# Patient Record
Sex: Male | Born: 1980 | Race: White | Hispanic: No | Marital: Single | Smoking: Former smoker
Health system: Southern US, Community
[De-identification: ages and names within clinical notes are randomized; demographics above are authoritative.]

## PROBLEM LIST (undated history)

## (undated) ENCOUNTER — Emergency Department (HOSPITAL_COMMUNITY): Admission: EM | Payer: No Typology Code available for payment source | Source: Home / Self Care

## (undated) DIAGNOSIS — F4310 Post-traumatic stress disorder, unspecified: Secondary | ICD-10-CM

## (undated) DIAGNOSIS — L309 Dermatitis, unspecified: Secondary | ICD-10-CM

## (undated) DIAGNOSIS — R569 Unspecified convulsions: Secondary | ICD-10-CM

## (undated) HISTORY — PX: HERNIA REPAIR: SHX51

---

## 2005-05-06 ENCOUNTER — Ambulatory Visit: Payer: Self-pay | Admitting: Family Medicine

## 2006-05-17 ENCOUNTER — Ambulatory Visit: Payer: Self-pay | Admitting: Family Medicine

## 2010-10-14 ENCOUNTER — Telehealth: Payer: Self-pay | Admitting: Family Medicine

## 2010-10-15 ENCOUNTER — Ambulatory Visit: Payer: Self-pay | Admitting: Family Medicine

## 2010-10-15 DIAGNOSIS — J392 Other diseases of pharynx: Secondary | ICD-10-CM | POA: Insufficient documentation
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
## 2010-12-29 NOTE — Assessment & Plan Note (Signed)
Summary: throat problem/rl   Vital Signs:  Patient profile:   30 year old male Height:      67 inches Weight:      169 pounds BMI:     26.56 Temp:     97.1 degrees F oral Pulse rate:   72 / minute Pulse rhythm:   regular BP sitting:   110 / 78  (left arm) Cuff size:   large  Vitals Entered By: Sydell Axon LPN (October 15, 2010 12:29 PM) CC: Has to keep clearing his throat constantly, this has been going on for about 6 months, previous medications given have not helped   History of Present Illness: Pt here for long recent history of difficulty clearing his throat which feels located about the level off  the manubrium. [facility] and [facility] given Clemastine (an old antihistamine) initially without diagnostics. He then had indirect laryngoscopy with a mirror and was thought to have callouses on his vocal chords, at which time he was  given Trimeprazine Tartrate(another antihistamine). When he went back the next time, they did nasopharyngoscopy, was told  "The callouses in the back of the throat are gone"  and was given no further medication  as the exam looked nml. He has continued to have the same sxs, the overwhelming one being thick PND requiring active clearing repeatedly. He took Mucinex yesterday and has been spitting up lots of congestion. He had a leak under the house which  he was initially unaware of and has recently fixed. His girlfriend who lives at his home with him has had stuffy nose but not the congestion requiring clearing the throat. He has lived at his  house for almost a year, sxs have been bothering him for the last six months, worst in the living room where he spends lots of time both sleeping on the couch and watching TV. The air vent is high up on the wall facing the couch. He has not had fever or chills, ear pain but has had some fleeting headaches. He does not smoke.  Problems Prior to Update: None  Medications Prior to Update: 1)   None  Physical Exam  General:  Well-developed,well-nourished,in no acute distress; alert,appropriate and cooperative throughout examination Head:  Normocephalic and atraumatic without obvious abnormalities. No apparent alopecia or balding. Eyes:  No corneal or conjunctival inflammation noted. EOMI. Perrla. Funduscopic exam benign, without hemorrhages, exudates or papilledema. Vision grossly normal. Ears:  External ear exam shows no significant lesions or deformities.  Otoscopic examination reveals clear canals, tympanic membranes are intact bilaterally without bulging, retraction, inflammation or discharge. Hearing is grossly normal bilaterally. Nose:  External nasal examination shows no deformity or inflammation. Nasal mucosa are pink and moist without lesions or exudates. Mouth:  Oral mucosa and oropharynx without lesions or exudates.  Teeth in good repair. Neck:  No deformities, masses, or tenderness noted. Chest Wall:  No deformities, masses, tenderness or gynecomastia noted. Lungs:  Normal respiratory effort, chest expands symmetrically. Lungs are clear to auscultation, no crackles or wheezes. Heart:  Normal rate and regular rhythm. S1 and S2 normal without gallop, murmur, click, rub or other extra sounds. Skin:  Intact without suspicious lesions or rashes   Impression & Recommendations:  Problem # 1:  UNSPECIFIED DISEASE OF PHARYNX (ICD-478.20) Assessment New  Will treat or attempt to treat for fungal infection and throat irritation. Trial of nystatin  to gargle and occas swallow. Trial of Flovent for chronic inflammation (  given samps) Recheck in 2-3 weeks.  Orders: No Charge Patient Arrived (NCPA0) (NCPA0)  Complete Medication List: 1)  Mucinex D 60-600 Mg Xr12h-tab (Pseudoephedrine-guaifenesin) .... As needed 2)  Nystatin 100000 Unit/ml Susp (Nystatin) .... Gargle  four times daily  Patient Instructions: 1)  RTC 2-3 weeks. Prescriptions: NYSTATIN 100000 UNIT/ML SUSP  (NYSTATIN) gargle  four times daily  #1 bottle x 2   Entered and Authorized by:   Shaune Leeks MD   Signed by:   Shaune Leeks MD on 10/15/2010   Method used:   Print then Give to Patient   RxID:   [id number]    Orders Added: 1)  No Charge Patient Arrived (NCPA0) [NCPA0]

## 2017-10-03 ENCOUNTER — Encounter (HOSPITAL_COMMUNITY): Payer: Self-pay | Admitting: Family Medicine

## 2017-10-03 DIAGNOSIS — S39012A Strain of muscle, fascia and tendon of lower back, initial encounter: Secondary | ICD-10-CM | POA: Diagnosis not present

## 2017-10-03 DIAGNOSIS — Z87891 Personal history of nicotine dependence: Secondary | ICD-10-CM | POA: Insufficient documentation

## 2017-10-03 DIAGNOSIS — S7001XA Contusion of right hip, initial encounter: Secondary | ICD-10-CM | POA: Insufficient documentation

## 2017-10-03 DIAGNOSIS — Y999 Unspecified external cause status: Secondary | ICD-10-CM | POA: Diagnosis not present

## 2017-10-03 DIAGNOSIS — Y939 Activity, unspecified: Secondary | ICD-10-CM | POA: Diagnosis not present

## 2017-10-03 DIAGNOSIS — S7011XA Contusion of right thigh, initial encounter: Secondary | ICD-10-CM | POA: Diagnosis not present

## 2017-10-03 DIAGNOSIS — Y92410 Unspecified street and highway as the place of occurrence of the external cause: Secondary | ICD-10-CM | POA: Diagnosis not present

## 2017-10-03 DIAGNOSIS — S3992XA Unspecified injury of lower back, initial encounter: Secondary | ICD-10-CM | POA: Diagnosis present
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
## 2017-10-04 ENCOUNTER — Emergency Department (HOSPITAL_COMMUNITY)
Admission: EM | Admit: 2017-10-04 | Discharge: 2017-10-04 | Disposition: A | Discharge status: Home / Self Care | Payer: Non-veteran care | Attending: Emergency Medicine | Admitting: Emergency Medicine

## 2017-10-04 ENCOUNTER — Emergency Department (HOSPITAL_COMMUNITY): Payer: Non-veteran care

## 2017-10-04 DIAGNOSIS — S7011XA Contusion of right thigh, initial encounter: Secondary | ICD-10-CM

## 2017-10-04 DIAGNOSIS — S7001XA Contusion of right hip, initial encounter: Secondary | ICD-10-CM

## 2017-10-04 DIAGNOSIS — S39012A Strain of muscle, fascia and tendon of lower back, initial encounter: Secondary | ICD-10-CM

## 2017-10-04 MED ORDER — IBUPROFEN 800 MG PO TABS: 800.0000 mg | ORAL_TABLET | Freq: Three times a day (TID) | ORAL | 0 refills | Status: DC | PRN

## 2017-10-04 MED ORDER — TRAMADOL HCL 50 MG PO TABS: 50.0000 mg | ORAL_TABLET | Freq: Four times a day (QID) | ORAL | 0 refills | Status: DC | PRN
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
## 2017-10-04 NOTE — Discharge Instructions (Signed)
Return here as needed.  Follow-up with your primary doctor. your x-rays did not show any abnormalities.  Use ice and heat on the area that is sore

## 2017-10-22 ENCOUNTER — Emergency Department (HOSPITAL_COMMUNITY): Payer: Non-veteran care

## 2017-10-22 ENCOUNTER — Emergency Department (HOSPITAL_COMMUNITY)
Admission: EM | Admit: 2017-10-22 | Discharge: 2017-10-22 | Disposition: A | Discharge status: Home / Self Care | Payer: Non-veteran care | Attending: Emergency Medicine | Admitting: Emergency Medicine

## 2017-10-22 ENCOUNTER — Other Ambulatory Visit: Payer: Self-pay

## 2017-10-22 DIAGNOSIS — W19XXXA Unspecified fall, initial encounter: Secondary | ICD-10-CM | POA: Diagnosis not present

## 2017-10-22 DIAGNOSIS — Z87891 Personal history of nicotine dependence: Secondary | ICD-10-CM | POA: Insufficient documentation

## 2017-10-22 DIAGNOSIS — Y939 Activity, unspecified: Secondary | ICD-10-CM | POA: Insufficient documentation

## 2017-10-22 DIAGNOSIS — Y998 Other external cause status: Secondary | ICD-10-CM | POA: Insufficient documentation

## 2017-10-22 DIAGNOSIS — F10920 Alcohol use, unspecified with intoxication, uncomplicated: Secondary | ICD-10-CM

## 2017-10-22 DIAGNOSIS — F10120 Alcohol abuse with intoxication, uncomplicated: Secondary | ICD-10-CM | POA: Insufficient documentation

## 2017-10-22 DIAGNOSIS — W010XXA Fall on same level from slipping, tripping and stumbling without subsequent striking against object, initial encounter: Secondary | ICD-10-CM

## 2017-10-22 DIAGNOSIS — Y92009 Unspecified place in unspecified non-institutional (private) residence as the place of occurrence of the external cause: Secondary | ICD-10-CM | POA: Insufficient documentation

## 2017-10-22 DIAGNOSIS — S01311A Laceration without foreign body of right ear, initial encounter: Secondary | ICD-10-CM | POA: Diagnosis not present

## 2017-10-22 DIAGNOSIS — S0101XA Laceration without foreign body of scalp, initial encounter: Secondary | ICD-10-CM | POA: Insufficient documentation

## 2017-10-22 MED ORDER — LIDOCAINE-EPINEPHRINE (PF) 2 %-1:200000 IJ SOLN
10.0000 mL | Freq: Once | INTRAMUSCULAR | Status: AC
  Administered 2017-10-22: 10 mL
  Filled 2017-10-22: qty 20

## 2017-10-22 NOTE — ED Notes (Signed)
Pt is refusing the CT scan. MD made aware

## 2017-10-22 NOTE — Discharge Instructions (Signed)
Staples need to be removed in 7-10 days. That can be done here, or at an urgent care center.  You did not allow me to do a CT scan tonight. I cannot be sure there is no brain injury from the fall. If you are having any problems at all, please return so CT scan can be done.
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
## 2017-11-01 ENCOUNTER — Encounter (HOSPITAL_COMMUNITY): Payer: Self-pay | Admitting: Family Medicine

## 2017-11-01 ENCOUNTER — Ambulatory Visit (HOSPITAL_COMMUNITY)
Admission: EM | Admit: 2017-11-01 | Discharge: 2017-11-01 | Disposition: A | Discharge status: Home / Self Care | Payer: No Typology Code available for payment source | Attending: Family Medicine | Admitting: Family Medicine

## 2017-11-01 DIAGNOSIS — Z4802 Encounter for removal of sutures: Secondary | ICD-10-CM

## 2017-11-01 NOTE — ED Provider Notes (Signed)
  [facility]   [id number] 11/01/17 Arrival Time: 1631   SUBJECTIVE:  Norberta KeensJohn M Tomaselli is a 36 y.o. male who presents to the urgent care with complaint of scalp staple removal from two separate scalp lacerations on Nov. 24     History reviewed. No pertinent past medical history. No family history on file. Social History   Socioeconomic History  . Marital status: Married    Spouse name: Not on file  . Number of children: Not on file  . Years of education: Not on file  . Highest education level: Not on file  Social Needs  . Financial resource strain: Not on file  . Food insecurity - worry: Not on file  . Food insecurity - inability: Not on file  . Transportation needs - medical: Not on file  . Transportation needs - non-medical: Not on file  Occupational History  . Not on file  Tobacco Use  . Smoking status: Former Games developermoker  . Smokeless tobacco: Never Used  Substance and Sexual Activity  . Alcohol use: Yes    Comment: Weekends   . Drug use: No  . Sexual activity: Not on file  Other Topics Concern  . Not on file  Social History Narrative  . Not on file   No outpatient medications have been marked as taking for the 11/01/17 encounter [facility](Hospital Encounter).   Not on File    ROS: As per HPI, remainder of ROS negative.   OBJECTIVE:   There were no vitals filed for this visit.   General appearance: alert; no distress Eyes: PERRL; EOMI; conjunctiva normal HENT: normocephalic; Staples removed by nurse. Skin: warm and dry Neurologic: normal gait; grossly normal Psychological: alert and cooperative; normal mood and affect      Labs:  No results found for this or any previous visit.  Labs Reviewed - No data to display  No results found.     ASSESSMENT & PLAN:  1. Encounter for staple removal     No orders of the defined types were placed in this encounter.   Reviewed expectations re: course of current medical issues. Questions  answered. Outlined signs and symptoms indicating need for more acute intervention. Patient verbalized understanding. After Visit Summary given.    Procedures:      Elvina SidleLauenstein, Rikki Smestad, MD 11/01/17 1659

## 2017-11-01 NOTE — ED Notes (Signed)
6 staples removed from head. Wound clean and dry. Well healed.

## 2018-12-07 ENCOUNTER — Other Ambulatory Visit: Payer: Self-pay

## 2018-12-07 ENCOUNTER — Emergency Department (HOSPITAL_COMMUNITY): Payer: No Typology Code available for payment source

## 2018-12-07 ENCOUNTER — Emergency Department (HOSPITAL_COMMUNITY)
Admission: EM | Admit: 2018-12-07 | Discharge: 2018-12-07 | Disposition: A | Discharge status: Home / Self Care | Payer: No Typology Code available for payment source | Attending: Emergency Medicine | Admitting: Emergency Medicine

## 2018-12-07 ENCOUNTER — Encounter (HOSPITAL_COMMUNITY): Payer: Self-pay | Admitting: Emergency Medicine

## 2018-12-07 DIAGNOSIS — I4891 Unspecified atrial fibrillation: Secondary | ICD-10-CM | POA: Diagnosis not present

## 2018-12-07 DIAGNOSIS — R569 Unspecified convulsions: Secondary | ICD-10-CM | POA: Diagnosis present

## 2018-12-07 DIAGNOSIS — E860 Dehydration: Secondary | ICD-10-CM | POA: Diagnosis not present

## 2018-12-07 DIAGNOSIS — I951 Orthostatic hypotension: Secondary | ICD-10-CM | POA: Diagnosis not present

## 2018-12-07 DIAGNOSIS — Z79899 Other long term (current) drug therapy: Secondary | ICD-10-CM | POA: Insufficient documentation

## 2018-12-07 DIAGNOSIS — Z87891 Personal history of nicotine dependence: Secondary | ICD-10-CM | POA: Diagnosis not present

## 2018-12-07 LAB — COMPREHENSIVE METABOLIC PANEL
ALK PHOS: 59 U/L (ref 38–126)
ALT: 45 U/L — ABNORMAL HIGH (ref 0–44)
ANION GAP: 14 (ref 5–15)
AST: 70 U/L — ABNORMAL HIGH (ref 15–41)
Albumin: 4.2 g/dL (ref 3.5–5.0)
BUN: 7 mg/dL (ref 6–20)
CALCIUM: 9.5 mg/dL (ref 8.9–10.3)
CO2: 26 mmol/L (ref 22–32)
CREATININE: 1.06 mg/dL (ref 0.61–1.24)
Chloride: 93 mmol/L — ABNORMAL LOW (ref 98–111)
GFR calc non Af Amer: >60 mL/min (ref >60)
Glucose, Bld: 129 mg/dL — ABNORMAL HIGH (ref 70–99)
Potassium: 4 mmol/L (ref 3.5–5.1)
SODIUM: 133 mmol/L — AB (ref 135–145)
Total Bilirubin: 2.5 mg/dL — ABNORMAL HIGH (ref 0.3–1.2)
Total Protein: 7.2 g/dL (ref 6.5–8.1)

## 2018-12-07 LAB — CBC WITH DIFFERENTIAL/PLATELET
Abs Immature Granulocytes: 0.04 10*3/uL (ref 0.00–0.07)
Basophils Absolute: 0 10*3/uL (ref 0.0–0.1)
Basophils Relative: 0 %
EOS ABS: 0 10*3/uL (ref 0.0–0.5)
EOS PCT: 0 %
HEMATOCRIT: 42.8 % (ref 39.0–52.0)
Hemoglobin: 14.9 g/dL (ref 13.0–17.0)
Immature Granulocytes: 1 %
Lymphocytes Relative: 12 %
Lymphs Abs: 0.8 10*3/uL (ref 0.7–4.0)
MCH: 33.5 pg (ref 26.0–34.0)
MCHC: 34.8 g/dL (ref 30.0–36.0)
MCV: 96.2 fL (ref 80.0–100.0)
MONOS PCT: 8 %
Monocytes Absolute: 0.6 10*3/uL (ref 0.1–1.0)
NRBC: 0 % (ref 0.0–0.2)
Neutro Abs: 5.3 10*3/uL (ref 1.7–7.7)
Neutrophils Relative %: 79 %
Platelets: 116 10*3/uL — ABNORMAL LOW (ref 150–400)
RBC: 4.45 MIL/uL (ref 4.22–5.81)
RDW: 12 % (ref 11.5–15.5)
WBC: 6.8 10*3/uL (ref 4.0–10.5)

## 2018-12-07 LAB — URINALYSIS, ROUTINE W REFLEX MICROSCOPIC
Bilirubin Urine: NEGATIVE
GLUCOSE, UA: NEGATIVE mg/dL
Hgb urine dipstick: NEGATIVE
Ketones, ur: 5 mg/dL — AB
LEUKOCYTES UA: NEGATIVE
NITRITE: NEGATIVE
PH: 8 (ref 5.0–8.0)
Protein, ur: NEGATIVE mg/dL
Specific Gravity, Urine: 1.005 (ref 1.005–1.030)

## 2018-12-07 LAB — RAPID URINE DRUG SCREEN, HOSP PERFORMED
Amphetamines: NOT DETECTED
Barbiturates: NOT DETECTED
Benzodiazepines: NOT DETECTED
COCAINE: NOT DETECTED
Opiates: NOT DETECTED
TETRAHYDROCANNABINOL: NOT DETECTED

## 2018-12-07 LAB — ETHANOL: Alcohol, Ethyl (B): <10 mg/dL (ref <10)

## 2018-12-07 MED ORDER — LORAZEPAM 2 MG/ML IJ SOLN: 1.0000 mg | Freq: Once | INTRAMUSCULAR | Status: DC | PRN

## 2018-12-07 MED ORDER — SODIUM CHLORIDE 0.9 % IV BOLUS
1000.0000 mL | Freq: Once | INTRAVENOUS | Status: AC
  Administered 2018-12-07: 1000 mL via INTRAVENOUS

## 2018-12-07 MED ORDER — DILTIAZEM HCL-DEXTROSE 100-5 MG/100ML-% IV SOLN (PREMIX)
5.0000 mg/h | INTRAVENOUS | Status: DC
  Filled 2018-12-07: qty 100

## 2018-12-07 MED ORDER — SODIUM CHLORIDE 0.9 % IV SOLN: INTRAVENOUS | Status: DC

## 2018-12-07 MED ORDER — ELIQUIS 5 MG VTE STARTER PACK: ORAL_TABLET | ORAL | 0 refills | Status: DC

## 2018-12-07 MED ORDER — DILTIAZEM HCL 25 MG/5ML IV SOLN: 10.0000 mg | Freq: Once | INTRAVENOUS | Status: DC

## 2018-12-07 MED ORDER — DILTIAZEM HCL ER COATED BEADS 120 MG PO CP24: 120.0000 mg | ORAL_CAPSULE | Freq: Every day | ORAL | 0 refills | Status: DC

## 2018-12-07 MED ORDER — DILTIAZEM HCL 25 MG/5ML IV SOLN
15.0000 mg | Freq: Once | INTRAVENOUS | Status: AC
  Administered 2018-12-07: 15 mg via INTRAVENOUS
  Filled 2018-12-07: qty 5
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
## 2018-12-07 NOTE — Discharge Instructions (Signed)
You have been diagnosed with atrial fibrillation.  This could increase your risk of developing blood clot in your lungs.  Please take Eliquis as prescribed.  Take cardizem to help control your heart rate.  Follow up with the afib clinic and with your doctor for further care.  Avoid alcohol use.  Return if your condition worsen.  Drink plenty of fluid and rest.

## 2018-12-07 NOTE — ED Notes (Signed)
Help get patient undress on the monitor patient is resting with call bell in reach and nurse at bedside 
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
## 2018-12-07 NOTE — ED Notes (Signed)
Patient transported to CT 

## 2018-12-07 NOTE — ED Notes (Signed)
Patient verbalizes understanding of discharge instructions. Opportunity for questioning and answers were provided. Armband removed by staff, pt discharged from ED. Given all belongings, ambulated out into lobby with family

## 2018-12-11 ENCOUNTER — Encounter (HOSPITAL_COMMUNITY): Payer: Self-pay | Admitting: Nurse Practitioner

## 2018-12-11 ENCOUNTER — Ambulatory Visit (HOSPITAL_COMMUNITY)
Admission: RE | Admit: 2018-12-11 | Discharge: 2018-12-11 | Disposition: A | Discharge status: Home / Self Care | Payer: No Typology Code available for payment source | Source: Ambulatory Visit | Attending: Nurse Practitioner | Admitting: Nurse Practitioner

## 2018-12-11 VITALS — BP 118/86 | HR 71 | Ht 67.0 in | Wt 170.4 lb

## 2018-12-11 DIAGNOSIS — Z87891 Personal history of nicotine dependence: Secondary | ICD-10-CM | POA: Diagnosis not present

## 2018-12-11 DIAGNOSIS — Z887 Allergy status to serum and vaccine status: Secondary | ICD-10-CM | POA: Insufficient documentation

## 2018-12-11 DIAGNOSIS — R9431 Abnormal electrocardiogram [ECG] [EKG]: Secondary | ICD-10-CM | POA: Diagnosis not present

## 2018-12-11 DIAGNOSIS — I4891 Unspecified atrial fibrillation: Secondary | ICD-10-CM | POA: Diagnosis not present
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
## 2019-03-04 ENCOUNTER — Other Ambulatory Visit: Payer: Self-pay

## 2019-03-04 ENCOUNTER — Emergency Department (HOSPITAL_COMMUNITY)
Admission: EM | Admit: 2019-03-04 | Discharge: 2019-03-05 | Disposition: A | Discharge status: Home / Self Care | Payer: No Typology Code available for payment source | Attending: Emergency Medicine | Admitting: Emergency Medicine

## 2019-03-04 DIAGNOSIS — Y900 Blood alcohol level of less than 20 mg/100 ml: Secondary | ICD-10-CM | POA: Diagnosis not present

## 2019-03-04 DIAGNOSIS — F1010 Alcohol abuse, uncomplicated: Secondary | ICD-10-CM | POA: Diagnosis not present

## 2019-03-04 DIAGNOSIS — R569 Unspecified convulsions: Secondary | ICD-10-CM

## 2019-03-04 DIAGNOSIS — Z87891 Personal history of nicotine dependence: Secondary | ICD-10-CM | POA: Diagnosis not present

## 2019-03-04 LAB — ETHANOL: Alcohol, Ethyl (B): <10 mg/dL (ref <10)

## 2019-03-04 LAB — COMPREHENSIVE METABOLIC PANEL
ALT: 45 U/L — ABNORMAL HIGH (ref 0–44)
AST: 75 U/L — ABNORMAL HIGH (ref 15–41)
Albumin: 4.4 g/dL (ref 3.5–5.0)
Alkaline Phosphatase: 64 U/L (ref 38–126)
Anion gap: 12 (ref 5–15)
BUN: 11 mg/dL (ref 6–20)
CO2: 28 mmol/L (ref 22–32)
Calcium: 9 mg/dL (ref 8.9–10.3)
Chloride: 93 mmol/L — ABNORMAL LOW (ref 98–111)
Creatinine, Ser: 0.92 mg/dL (ref 0.61–1.24)
GFR calc Af Amer: >60 mL/min (ref >60)
GFR calc non Af Amer: >60 mL/min (ref >60)
Glucose, Bld: 112 mg/dL — ABNORMAL HIGH (ref 70–99)
Potassium: 3.7 mmol/L (ref 3.5–5.1)
Sodium: 133 mmol/L — ABNORMAL LOW (ref 135–145)
Total Bilirubin: 2.2 mg/dL — ABNORMAL HIGH (ref 0.3–1.2)
Total Protein: 7.5 g/dL (ref 6.5–8.1)

## 2019-03-04 LAB — CBC WITH DIFFERENTIAL/PLATELET
Abs Immature Granulocytes: 0.01 10*3/uL (ref 0.00–0.07)
Basophils Absolute: 0 10*3/uL (ref 0.0–0.1)
Basophils Relative: 0 %
Eosinophils Absolute: 0 10*3/uL (ref 0.0–0.5)
Eosinophils Relative: 0 %
HCT: 40.3 % (ref 39.0–52.0)
Hemoglobin: 14.2 g/dL (ref 13.0–17.0)
Immature Granulocytes: 0 %
Lymphocytes Relative: 14 %
Lymphs Abs: 0.7 10*3/uL (ref 0.7–4.0)
MCH: 34.1 pg — ABNORMAL HIGH (ref 26.0–34.0)
MCHC: 35.2 g/dL (ref 30.0–36.0)
MCV: 96.9 fL (ref 80.0–100.0)
Monocytes Absolute: 0.5 10*3/uL (ref 0.1–1.0)
Monocytes Relative: 10 %
Neutro Abs: 3.9 10*3/uL (ref 1.7–7.7)
Neutrophils Relative %: 76 %
Platelets: 109 10*3/uL — ABNORMAL LOW (ref 150–400)
RBC: 4.16 MIL/uL — ABNORMAL LOW (ref 4.22–5.81)
RDW: 12.4 % (ref 11.5–15.5)
WBC: 5.1 10*3/uL (ref 4.0–10.5)
nRBC: 0 % (ref 0.0–0.2)

## 2019-03-04 LAB — RAPID URINE DRUG SCREEN, HOSP PERFORMED
Amphetamines: NOT DETECTED
Barbiturates: NOT DETECTED
Benzodiazepines: NOT DETECTED
Cocaine: POSITIVE — AB
Opiates: NOT DETECTED
Tetrahydrocannabinol: POSITIVE — AB

## 2019-03-04 MED ORDER — LORAZEPAM 1 MG PO TABS
1.0000 mg | ORAL_TABLET | Freq: Once | ORAL | Status: DC
  Filled 2019-03-04: qty 1

## 2019-03-04 MED ORDER — SODIUM CHLORIDE 0.9 % IV BOLUS
1000.0000 mL | Freq: Once | INTRAVENOUS | Status: AC
  Administered 2019-03-04: 1000 mL via INTRAVENOUS

## 2019-03-04 NOTE — ED Notes (Signed)
Bed: AT55 Expected date:  Expected time:  Means of arrival:  Comments: 38 yo M/seizures

## 2019-03-04 NOTE — ED Notes (Signed)
Pt stated that he feels light headed. Bed in locked and lowest position. Pt sitting upright

## 2019-03-04 NOTE — ED Notes (Signed)
Pt able to ambulate without assistance to the restroom and back. Pt denies any dizziness or other complaints.

## 2019-03-04 NOTE — ED Provider Notes (Signed)
[facility]   CSN: [id number] Arrival date & time: 03/04/19  2119    History   Chief Complaint Chief Complaint  Patient presents with  . Seizures    HPI SALATHIEL FERRARA is a 38 y.o. male.     Patient is a 38 year old male who has a history of alcohol abuse who presents with a seizure.  I spoke with his girlfriend who states that he was lying in bed watching a movie and had sudden onset of shaking all over.  It lasted about 2 minutes.  He was confused following this episode.  She states that he has had 2 other episodes of similar symptoms.  One was associated with a seizure/syncopal episode in concurrence with A. fib with RVR.  He has not ever seen a neurologist for the symptoms.  Patient says that he remembers one other episode that had happened after he had stopped drinking for a day or 2.  He states that he has not had any alcohol for about a day.  He denies any drug use.  He states he feels a little dizzy now but otherwise okay.  No recent fevers.  No headache or neck pain.  He denies any fall from the incident.  He denies any recent illnesses.     No past medical history on file.  Patient Active Problem List   Diagnosis Date Noted  . UNSPECIFIED DISEASE OF PHARYNX 10/15/2010    Past Surgical History:  Procedure Laterality Date  . HERNIA REPAIR          Home Medications    Prior to Admission medications   Medication Sig Start Date End Date Taking? Authorizing Provider  diltiazem (CARDIZEM CD) 120 MG 24 hr capsule Take 1 capsule (120 mg total) by mouth daily. Patient not taking: Reported on 12/11/2018 12/07/18   Fayrene Helper, PA-C  ELIQUIS STARTER PACK (ELIQUIS STARTER PACK) 5 MG TABS Take as directed on package: start with two-5mg  tablets twice daily for 7 days. On day 8, switch to one-5mg  tablet twice daily. Patient not taking: Reported on 12/11/2018 12/07/18   Fayrene Helper, PA-C    Family History No family history on file.   Social History Social History   Tobacco Use  . Smoking status: Former Games developer  . Smokeless tobacco: Never Used  Substance Use Topics  . Alcohol use: Yes    Comment: Weekends   . Drug use: No     Allergies   Smallpox vaccine   Review of Systems Review of Systems  Constitutional: Positive for fatigue. Negative for chills, diaphoresis and fever.  HENT: Negative for congestion, rhinorrhea and sneezing.   Eyes: Negative.   Respiratory: Negative for cough, chest tightness and shortness of breath.   Cardiovascular: Negative for chest pain and leg swelling.  Gastrointestinal: Negative for abdominal pain, blood in stool, diarrhea, nausea and vomiting.  Genitourinary: Negative for difficulty urinating, flank pain, frequency and hematuria.  Musculoskeletal: Negative for arthralgias and back pain.  Skin: Negative for rash.  Neurological: Positive for dizziness, seizures and light-headedness. Negative for speech difficulty, weakness, numbness and headaches.     Physical Exam Updated Vital Signs BP (!) 154/94   Pulse 90   Temp 98.9 F (37.2 C) (Oral)   Resp 16   Ht  (1.702 m)   Wt 68 kg   SpO2 99%   BMI 23.49 kg/m   Physical Exam Constitutional:      Appearance: He is well-developed.  HENT:  Head: Normocephalic and atraumatic.  Eyes:     Pupils: Pupils are equal, round, and reactive to light.  Neck:     Musculoskeletal: Normal range of motion and neck supple.  Cardiovascular:     Rate and Rhythm: Normal rate and regular rhythm.     Heart sounds: Normal heart sounds.  Pulmonary:     Effort: Pulmonary effort is normal. No respiratory distress.     Breath sounds: Normal breath sounds. No wheezing or rales.  Chest:     Chest wall: No tenderness.  Abdominal:     General: Bowel sounds are normal.     Palpations: Abdomen is soft.     Tenderness: There is no abdominal tenderness. There is no guarding or rebound.  Musculoskeletal: Normal range of motion.   Lymphadenopathy:     Cervical: No cervical adenopathy.  Skin:    General: Skin is warm and dry.     Findings: No rash.  Neurological:     Mental Status: He is alert and oriented to person, place, and time.     Cranial Nerves: No cranial nerve deficit.     Sensory: No sensory deficit.     Motor: No weakness.     Coordination: Coordination normal.     Comments: Very slight tremor      ED Treatments / Results  Labs (all labs ordered are listed, but only abnormal results are displayed) Labs Reviewed  COMPREHENSIVE METABOLIC PANEL - Abnormal; Notable for the following components:      Result Value   Sodium 133 (*)    Chloride 93 (*)    Glucose, Bld 112 (*)    AST 75 (*)    ALT 45 (*)    Total Bilirubin 2.2 (*)    All other components within normal limits  CBC WITH DIFFERENTIAL/PLATELET - Abnormal; Notable for the following components:   RBC 4.16 (*)    [facility] 34.1 (*)    Platelets 109 (*)    All other components within normal limits  RAPID URINE DRUG SCREEN, HOSP PERFORMED - Abnormal; Notable for the following components:   Cocaine POSITIVE (*)    Tetrahydrocannabinol POSITIVE (*)    All other components within normal limits  ETHANOL    EKG None  Radiology No results found.  Procedures Procedures (including critical care time)  Medications Ordered in ED Medications  LORazepam (ATIVAN) tablet 1 mg (has no administration in time range)  sodium chloride 0.9 % bolus 1,000 mL (0 mLs Intravenous Stopped 03/04/19 2335)     Initial Impression / Assessment and Plan / ED Course  I have reviewed the triage vital signs and the nursing notes.  Pertinent labs & imaging results that were available during my care of the patient were reviewed by me and considered in my medical decision making (see chart for details).        Patient is a 38 year old male who presents after seizure.  He states he has had 2 prior seizures in the past.  Is unclear whether these are all  alcohol-related but I suspect that they are.  He had one seizure that was in relationship to an episode of atrial fibrillation.  He has not had any further seizure activity tonight.  He has been monitored here in the ED for about 3 hours.  He has a very minimal tremor but no other signs of withdrawal.  He is not tachycardic.  He has no confusion.  He is able to ambulate in the ED without  ataxia.  His labs are non-concerning.  He was discharged home in good condition.  He was given resources for alcohol treatment.  He was advised to follow-up with a neurologist given that this is likely his third seizure.  However given that in the setting of alcohol use, I did not start him on anticonvulsant medications.  His tox screen was positive for cocaine but he currently does not have headache, vomiting, confusion or other indications for head CT.  Return precautions were given.  Final Clinical Impressions(s) / ED Diagnoses   Final diagnoses:  Seizure [place])  Alcohol abuse    ED Discharge Orders    None       Rolan Bucco, MD 03/04/19 2359

## 2019-03-04 NOTE — ED Notes (Signed)
Urine and culture sent to lab  

## 2019-03-04 NOTE — ED Triage Notes (Signed)
Per EMS, pt coming from home. Seizure activity lasting 2 min tonic-clonic. Hx ETOH & TBI [place]. PTSD. Attempting to detox. Last alcohol 24 hours ago.   134 94 CBG 126 96 was 190  20G RT AC

## 2019-03-05 NOTE — ED Notes (Signed)
Pt given discharge paperwork and verbalized understanding of follow up care. Pt A+OX4 and ambulatory.

## 2019-04-28 ENCOUNTER — Emergency Department (HOSPITAL_COMMUNITY)
Admission: EM | Admit: 2019-04-28 | Discharge: 2019-04-28 | Disposition: A | Discharge status: Home / Self Care | Payer: No Typology Code available for payment source | Attending: Emergency Medicine | Admitting: Emergency Medicine

## 2019-04-28 ENCOUNTER — Other Ambulatory Visit: Payer: Self-pay

## 2019-04-28 ENCOUNTER — Encounter (HOSPITAL_COMMUNITY): Payer: Self-pay

## 2019-04-28 DIAGNOSIS — F17200 Nicotine dependence, unspecified, uncomplicated: Secondary | ICD-10-CM | POA: Insufficient documentation

## 2019-04-28 DIAGNOSIS — R569 Unspecified convulsions: Secondary | ICD-10-CM | POA: Insufficient documentation

## 2019-04-28 DIAGNOSIS — R4020 Unspecified coma: Secondary | ICD-10-CM | POA: Insufficient documentation

## 2019-04-28 HISTORY — DX: Unspecified convulsions: R56.9

## 2019-04-28 LAB — BASIC METABOLIC PANEL
Anion gap: 22 — ABNORMAL HIGH (ref 5–15)
BUN: 9 mg/dL (ref 6–20)
CO2: 21 mmol/L — ABNORMAL LOW (ref 22–32)
Calcium: 9.3 mg/dL (ref 8.9–10.3)
Chloride: 92 mmol/L — ABNORMAL LOW (ref 98–111)
Creatinine, Ser: 1.15 mg/dL (ref 0.61–1.24)
GFR calc Af Amer: >60 mL/min (ref >60)
GFR calc non Af Amer: >60 mL/min (ref >60)
Glucose, Bld: 150 mg/dL — ABNORMAL HIGH (ref 70–99)
Potassium: 3.6 mmol/L (ref 3.5–5.1)
Sodium: 135 mmol/L (ref 135–145)

## 2019-04-28 LAB — CBC
HCT: 38.3 % — ABNORMAL LOW (ref 39.0–52.0)
Hemoglobin: 13.2 g/dL (ref 13.0–17.0)
MCH: 33.9 pg (ref 26.0–34.0)
MCHC: 34.5 g/dL (ref 30.0–36.0)
MCV: 98.5 fL (ref 80.0–100.0)
Platelets: 106 10*3/uL — ABNORMAL LOW (ref 150–400)
RBC: 3.89 MIL/uL — ABNORMAL LOW (ref 4.22–5.81)
RDW: 12.1 % (ref 11.5–15.5)
WBC: 7.3 10*3/uL (ref 4.0–10.5)
nRBC: 0 % (ref 0.0–0.2)

## 2019-04-28 LAB — CBG MONITORING, ED: Glucose-Capillary: 148 mg/dL — ABNORMAL HIGH (ref 70–99)

## 2019-04-28 MED ORDER — CHLORDIAZEPOXIDE HCL 25 MG PO CAPS
100.0000 mg | ORAL_CAPSULE | Freq: Once | ORAL | Status: AC
  Administered 2019-04-28: 100 mg via ORAL
  Filled 2019-04-28: qty 4

## 2019-04-28 MED ORDER — CHLORDIAZEPOXIDE HCL 25 MG PO CAPS: ORAL_CAPSULE | ORAL | 0 refills | Status: DC

## 2019-04-28 MED ORDER — LORAZEPAM 2 MG/ML IJ SOLN
1.0000 mg | Freq: Once | INTRAMUSCULAR | Status: AC
  Administered 2019-04-28: 19:00:00 1 mg via INTRAVENOUS
  Filled 2019-04-28: qty 1

## 2019-04-28 NOTE — ED Notes (Signed)
Pt given strict instructions on return if detox is severe at home

## 2019-04-28 NOTE — Discharge Instructions (Signed)
Follow up with your neurologist or the neurologist in our symptoms.

## 2019-04-28 NOTE — ED Provider Notes (Signed)
[facility] EMERGENCY DEPARTMENT Provider Note   CSN: [id number] Arrival date & time: 04/28/19  1823    History   Chief Complaint Chief Complaint  Patient presents with  . Seizures  . Alcohol Intoxication    HPI Sean Fields is a 38 y.o. male.     38 yo M with a chief complaint of loss of consciousness.  Patient states that he was watching TV and suddenly was woken up by his girlfriend and EMS.  Reportedly the patient had some shaking-like activity.  He states he was immediately back to baseline.  Denies loss of bowel or bladder, denies biting of the tongue.  This is the third such episode that the patient has had.  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  The patient thinks he last had a drink yesterday.  Denies chest pain shortness of breath abdominal pain vomiting diarrhea.  The history is provided by the patient.  Seizures  Seizure activity on arrival: no   Alcohol Intoxication  Pertinent negatives include no chest pain, no abdominal pain, no headaches and no shortness of breath.    Past Medical History:  Diagnosis Date  . 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 UNSPECIFIED DISEASE OF PHARYNX 10/15/2010    Past Surgical History:  Procedure Laterality Date  . HERNIA REPAIR          Home Medications    Prior to Admission medications   Medication Sig Start Date End Date Taking? Authorizing Provider  diltiazem (CARDIZEM CD) 120 MG 24 hr capsule Take 1 capsule (120 mg total) by mouth daily. Patient not taking: Reported on 12/11/2018 12/07/18   Fayrene Helper, PA-C  ELIQUIS STARTER PACK (ELIQUIS STARTER PACK) 5 MG TABS Take as directed on package: start with two-5mg  tablets twice daily for 7 days. On day 8, switch to one-5mg  tablet twice daily. Patient not taking: Reported on 12/11/2018 12/07/18   Fayrene Helper, PA-C    Family History History reviewed. No  pertinent family history.  Social History Social History   Tobacco Use  . Smoking status: Current Some Day Smoker  . Smokeless tobacco: Never Used  Substance Use Topics  . Alcohol use: Yes    Comment: Weekends (girlfriend states every day)  . Drug use: No     Allergies   Smallpox vaccine   Review of Systems Review of Systems  Constitutional: Negative for chills and fever.  HENT: Negative for congestion and facial swelling.   Eyes: Negative for discharge and visual disturbance.  Respiratory: Negative for shortness of breath.   Cardiovascular: Negative for chest pain and palpitations.  Gastrointestinal: Negative for abdominal pain, diarrhea and vomiting.  Musculoskeletal: Negative for arthralgias and myalgias.  Skin: Negative for color change and rash.  Neurological: Positive for seizures. Negative for tremors, syncope and headaches.  Psychiatric/Behavioral: Negative for confusion and dysphoric mood.     Physical Exam Updated Vital Signs BP 119/88   Pulse 96   Resp (!) 27   Ht 5\' 7"  (1.702 m)   Wt 68 kg   SpO2 91%   BMI 23.49 kg/m   Physical Exam Vitals signs and nursing note reviewed.  Constitutional:      Appearance: He is well-developed.  HENT:     Head: Normocephalic and atraumatic.  Eyes:     Pupils: Pupils are equal, round, and reactive to light.  Neck:  Musculoskeletal: Normal range of motion and neck supple.     Vascular: No JVD.  Cardiovascular:     Rate and Rhythm: Normal rate and regular rhythm.     Heart sounds: No murmur. No friction rub. No gallop.   Pulmonary:     Effort: No respiratory distress.     Breath sounds: No wheezing.  Abdominal:     General: There is no distension.     Tenderness: There is no guarding or rebound.  Musculoskeletal: Normal range of motion.  Skin:    Coloration: Skin is not pale.     Findings: No rash.  Neurological:     Mental Status: He is alert and oriented to person, place, and time.  Psychiatric:         Behavior: Behavior normal.      ED Treatments / Results  Labs (all labs ordered are listed, but only abnormal results are displayed) Labs Reviewed  CBC - Abnormal; Notable for the following components:      Result Value   RBC 3.89 (*)    HCT 38.3 (*)    Platelets 106 (*)    All other components within normal limits  BASIC METABOLIC PANEL - Abnormal; Notable for the following components:   Chloride 92 (*)    CO2 21 (*)    Glucose, Bld 150 (*)    Anion gap 22 (*)    All other components within normal limits  CBG MONITORING, ED - Abnormal; Notable for the following components:   Glucose-Capillary 148 (*)    All other components within normal limits  CBG MONITORING, ED    EKG EKG Interpretation  Date/Time:  Saturday Apr 28 2019 18:29:16 EDT Ventricular Rate:  106 PR Interval:    QRS Duration: 94 QT Interval:  378 QTC Calculation: 502 R Axis:   53 Text Interpretation:  Sinus tachycardia Abnormal R-wave progression, early transition Borderline ST elevation, lateral leads Prolonged QT interval No significant change since last tracing Confirmed by Melene Plan [id number]) on 04/28/2019 7:14:27 PM   Radiology No results found.  Procedures Procedures (including critical care time)  Medications Ordered in ED Medications  LORazepam (ATIVAN) injection 1 mg (has no administration in time range)  chlordiazePOXIDE (LIBRIUM) capsule 100 mg (has no administration in time range)     Initial Impression / Assessment and Plan / ED Course  I have reviewed the triage vital signs and the nursing notes.  Pertinent labs & imaging results that were available during my care of the patient were reviewed by me and considered in my medical decision making (see chart for details).        38 yo M with a chief complaint of loss of consciousness.  Seizure versus syncope.  Patient says he stopped drinking yesterday though shows no significant signs of withdrawal his heart rate is normal he has  no hypertension.  We will give a dose of Ativan and Librium.  Check basic lab work.  EKG.   labwork without significant electrolyte abnormality.  He does have an elevated anion gap which could be due to a lactic acidosis from possible seizure.  As this is now his third possible events will refer him to neurology for evaluation.  7:17 PM:  I have discussed the diagnosis/risks/treatment options with the patient and believe the pt to be eligible for discharge home to follow-up with PCP, neuro. We also discussed returning to the ED immediately if new or worsening sx occur. We discussed the sx which are  most concerning (e.g., sudden worsening pain, fever, inability to tolerate by mouth) that necessitate immediate return. Medications administered to the patient during their visit and any new prescriptions provided to the patient are listed below.  Medications given during this visit Medications  LORazepam (ATIVAN) injection 1 mg (has no administration in time range)  chlordiazePOXIDE (LIBRIUM) capsule 100 mg (has no administration in time range)     The patient appears reasonably screen and/or stabilized for discharge and I doubt any other medical condition or other [facility] requiring further screening, evaluation, or treatment in the ED at this time prior to discharge.   Final Clinical Impressions(s) / ED Diagnoses   Final diagnoses:  Loss of consciousness [facility](HCC)    ED Discharge Orders         Ordered    Ambulatory referral to Neurology    Comments:  Seizure disorder?   04/28/19 1916           Melene PlanFloyd, Coty Student, DO 04/28/19 [id number]

## 2019-04-28 NOTE — ED Triage Notes (Signed)
Per [facility], pt had 30 second seizure witness by his girlfriend while at her house. She states that pt drinks daily but hasn't drank in over 24 hours. Pt had brief postictal period then became axox4. Pt appears slightly anxious, has headache and tremors. VSS.

## 2019-11-29 ENCOUNTER — Inpatient Hospital Stay (HOSPITAL_COMMUNITY)
Admission: EM | Admit: 2019-11-29 | Discharge: 2019-12-01 | DRG: 897 | Disposition: A | Discharge status: Home / Self Care | Payer: No Typology Code available for payment source | Attending: Internal Medicine | Admitting: Internal Medicine

## 2019-11-29 ENCOUNTER — Encounter (HOSPITAL_COMMUNITY): Payer: Self-pay

## 2019-11-29 ENCOUNTER — Other Ambulatory Visit: Payer: Self-pay

## 2019-11-29 ENCOUNTER — Emergency Department (HOSPITAL_COMMUNITY): Payer: No Typology Code available for payment source

## 2019-11-29 DIAGNOSIS — F10239 Alcohol dependence with withdrawal, unspecified: Principal | ICD-10-CM | POA: Diagnosis present

## 2019-11-29 DIAGNOSIS — Z7901 Long term (current) use of anticoagulants: Secondary | ICD-10-CM

## 2019-11-29 DIAGNOSIS — R079 Chest pain, unspecified: Secondary | ICD-10-CM | POA: Diagnosis present

## 2019-11-29 DIAGNOSIS — D696 Thrombocytopenia, unspecified: Secondary | ICD-10-CM | POA: Diagnosis present

## 2019-11-29 DIAGNOSIS — D6959 Other secondary thrombocytopenia: Secondary | ICD-10-CM | POA: Diagnosis present

## 2019-11-29 DIAGNOSIS — R569 Unspecified convulsions: Secondary | ICD-10-CM | POA: Diagnosis present

## 2019-11-29 DIAGNOSIS — R05 Cough: Secondary | ICD-10-CM | POA: Diagnosis present

## 2019-11-29 DIAGNOSIS — R7989 Other specified abnormal findings of blood chemistry: Secondary | ICD-10-CM | POA: Diagnosis present

## 2019-11-29 DIAGNOSIS — E871 Hypo-osmolality and hyponatremia: Secondary | ICD-10-CM | POA: Diagnosis present

## 2019-11-29 DIAGNOSIS — F17200 Nicotine dependence, unspecified, uncomplicated: Secondary | ICD-10-CM | POA: Diagnosis present

## 2019-11-29 DIAGNOSIS — Z79899 Other long term (current) drug therapy: Secondary | ICD-10-CM

## 2019-11-29 DIAGNOSIS — F10230 Alcohol dependence with withdrawal, uncomplicated: Secondary | ICD-10-CM

## 2019-11-29 DIAGNOSIS — R739 Hyperglycemia, unspecified: Secondary | ICD-10-CM | POA: Diagnosis present

## 2019-11-29 DIAGNOSIS — R9431 Abnormal electrocardiogram [ECG] [EKG]: Secondary | ICD-10-CM | POA: Diagnosis present

## 2019-11-29 DIAGNOSIS — Z20822 Contact with and (suspected) exposure to covid-19: Secondary | ICD-10-CM | POA: Diagnosis present

## 2019-11-29 DIAGNOSIS — E860 Dehydration: Secondary | ICD-10-CM | POA: Diagnosis present

## 2019-11-29 DIAGNOSIS — F10939 Alcohol use, unspecified with withdrawal, unspecified: Secondary | ICD-10-CM | POA: Diagnosis present

## 2019-11-29 LAB — CBC WITH DIFFERENTIAL/PLATELET
Abs Immature Granulocytes: 0.02 10*3/uL (ref 0.00–0.07)
Basophils Absolute: 0 10*3/uL (ref 0.0–0.1)
Basophils Relative: 0 %
Eosinophils Absolute: 0 10*3/uL (ref 0.0–0.5)
Eosinophils Relative: 0 %
HCT: 41.3 % (ref 39.0–52.0)
Hemoglobin: 14.2 g/dL (ref 13.0–17.0)
Immature Granulocytes: 0 %
Lymphocytes Relative: 9 %
Lymphs Abs: 0.4 10*3/uL — ABNORMAL LOW (ref 0.7–4.0)
MCH: 31.4 pg (ref 26.0–34.0)
MCHC: 34.4 g/dL (ref 30.0–36.0)
MCV: 91.4 fL (ref 80.0–100.0)
Monocytes Absolute: 0.4 10*3/uL (ref 0.1–1.0)
Monocytes Relative: 7 %
Neutro Abs: 4.3 10*3/uL (ref 1.7–7.7)
Neutrophils Relative %: 84 %
Platelets: 65 10*3/uL — ABNORMAL LOW (ref 150–400)
RBC: 4.52 MIL/uL (ref 4.22–5.81)
RDW: 13.6 % (ref 11.5–15.5)
WBC: 5.2 10*3/uL (ref 4.0–10.5)
nRBC: 0 % (ref 0.0–0.2)

## 2019-11-29 LAB — COMPREHENSIVE METABOLIC PANEL
ALT: 173 U/L — ABNORMAL HIGH (ref 0–44)
AST: 186 U/L — ABNORMAL HIGH (ref 15–41)
Albumin: 4.1 g/dL (ref 3.5–5.0)
Alkaline Phosphatase: 54 U/L (ref 38–126)
Anion gap: 13 (ref 5–15)
BUN: 12 mg/dL (ref 6–20)
CO2: 27 mmol/L (ref 22–32)
Calcium: 9.5 mg/dL (ref 8.9–10.3)
Chloride: 94 mmol/L — ABNORMAL LOW (ref 98–111)
Creatinine, Ser: 0.94 mg/dL (ref 0.61–1.24)
GFR calc Af Amer: >60 mL/min (ref >60)
GFR calc non Af Amer: >60 mL/min (ref >60)
Glucose, Bld: 178 mg/dL — ABNORMAL HIGH (ref 70–99)
Potassium: 4.5 mmol/L (ref 3.5–5.1)
Sodium: 134 mmol/L — ABNORMAL LOW (ref 135–145)
Total Bilirubin: 2.2 mg/dL — ABNORMAL HIGH (ref 0.3–1.2)
Total Protein: 6.4 g/dL — ABNORMAL LOW (ref 6.5–8.1)

## 2019-11-29 LAB — RAPID URINE DRUG SCREEN, HOSP PERFORMED
Amphetamines: NOT DETECTED
Barbiturates: NOT DETECTED
Benzodiazepines: NOT DETECTED
Cocaine: NOT DETECTED
Opiates: NOT DETECTED
Tetrahydrocannabinol: NOT DETECTED

## 2019-11-29 LAB — ETHANOL: Alcohol, Ethyl (B): <10 mg/dL (ref <10)

## 2019-11-29 MED ORDER — LORAZEPAM 1 MG PO TABS: 0.0000 mg | ORAL_TABLET | Freq: Four times a day (QID) | ORAL | Status: DC

## 2019-11-29 MED ORDER — LORAZEPAM 2 MG/ML IJ SOLN
0.0000 mg | Freq: Four times a day (QID) | INTRAMUSCULAR | Status: DC
  Administered 2019-11-29 (×2): 1 mg via INTRAVENOUS
  Filled 2019-11-29 (×2): qty 1

## 2019-11-29 MED ORDER — THIAMINE HCL 100 MG/ML IJ SOLN
100.0000 mg | Freq: Every day | INTRAMUSCULAR | Status: DC
  Administered 2019-11-29: 19:00:00 100 mg via INTRAVENOUS
  Filled 2019-11-29: qty 2

## 2019-11-29 MED ORDER — LORAZEPAM 2 MG/ML IJ SOLN: 0.0000 mg | Freq: Two times a day (BID) | INTRAMUSCULAR | Status: DC

## 2019-11-29 MED ORDER — LORAZEPAM 1 MG PO TABS: 0.0000 mg | ORAL_TABLET | Freq: Two times a day (BID) | ORAL | Status: DC

## 2019-11-29 MED ORDER — THIAMINE HCL 100 MG PO TABS: 100.0000 mg | ORAL_TABLET | Freq: Every day | ORAL | Status: DC

## 2019-11-29 NOTE — ED Provider Notes (Signed)
[facility] EMERGENCY DEPARTMENT Provider Note   CSN: [id number] Arrival date & time: 11/29/19  1740     History Chief Complaint  Patient presents with  . Seizures    Sean Fields is a 38 y.o. male. The past medical history of seizures due to alcohol withdrawal who presents today for evaluation of a seizure.  Patient reportedly had a seizure lasting about 30 to 45 seconds.  His girlfriend initially was not concerned however he reportedly started to turn blue prompting her to call EMS.  Patient states his last drink was yesterday.  EMS states patient's heart rate was initially in the 170s however then returned to a sinus rhythm without tachycardia.  Patient reports that he has had chest pain since noon.  He denies any recreational substances.  He reports he has a cough that is chronic for him and his baseline.  He denies any fevers or recent illness.  He reports that his chest hurts.  It has not radiated or moved.  With patient's permission I spoke with his girlfriend Shanda Bumps, she reports that the last time he had a seizure was April.  He did stop drinking initially last weekend and was shaky then however did not have a witnessed seizure.   She denies any falls or significant trauma during the seizure.   HPI     Past Medical History:  Diagnosis Date  . 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 UNSPECIFIED DISEASE OF PHARYNX 10/15/2010    Past Surgical History:  Procedure Laterality Date  . HERNIA REPAIR         History reviewed. No pertinent family history.  Social History   Tobacco Use  . Smoking status: Current Some Day Smoker  . Smokeless tobacco: Never Used  Substance Use Topics  . Alcohol use: Yes    Comment: Weekends (girlfriend states every day)  . Drug use: No    Home Medications Prior to Admission medications   Medication Sig Start Date End Date Taking? Authorizing  Provider  chlordiazePOXIDE (LIBRIUM) 25 MG capsule 50mg  PO TID x 1D, then 25-50mg  PO BID X 1D, then 25-50mg  PO QD X 1D 04/28/19   04/30/19, DO  diltiazem (CARDIZEM CD) 120 MG 24 hr capsule Take 1 capsule (120 mg total) by mouth daily. Patient not taking: Reported on 12/11/2018 12/07/18   02/05/19, PA-C  ELIQUIS STARTER PACK (ELIQUIS STARTER PACK) 5 MG TABS Take as directed on package: start with two-5mg  tablets twice daily for 7 days. On day 8, switch to one-5mg  tablet twice daily. Patient not taking: Reported on 12/11/2018 12/07/18   02/05/19, PA-C    Allergies    Smallpox vaccine  Review of Systems   Review of Systems  Constitutional: Negative for chills and fever.  HENT: Negative for congestion.   Eyes: Negative for visual disturbance.  Respiratory: Positive for cough (Chronic, unchanged from normal. ). Negative for chest tightness.   Cardiovascular: Positive for chest pain. Negative for palpitations and leg swelling.  Gastrointestinal: Negative for abdominal pain, nausea, rectal pain and vomiting.  Genitourinary: Negative for dysuria.  Musculoskeletal: Negative for back pain and neck pain.  Neurological: Positive for seizures. Negative for weakness and headaches.  All other systems reviewed and are negative.   Physical Exam Updated Vital Signs BP (!) 141/101   Pulse 85   Temp 98.9 F (37.2 C) (Oral)   Resp  20   Ht 5\' 7"  (1.702 m)   Wt 68 kg   SpO2 96%   BMI 23.49 kg/m   Physical Exam Vitals and nursing note reviewed.  Constitutional:      Appearance: He is well-developed.  HENT:     Head: Normocephalic and atraumatic.     Mouth/Throat:     Mouth: Mucous membranes are moist.  Eyes:     Conjunctiva/sclera: Conjunctivae normal.  Cardiovascular:     Rate and Rhythm: Normal rate and regular rhythm.     Pulses: Normal pulses.     Heart sounds: Normal heart sounds. No murmur.  Pulmonary:     Effort: Pulmonary effort is normal. No respiratory distress.     Breath  sounds: Normal breath sounds.  Abdominal:     Palpations: Abdomen is soft.     Tenderness: There is no abdominal tenderness.  Musculoskeletal:     Cervical back: Normal range of motion and neck supple. No rigidity.  Skin:    General: Skin is warm and dry.  Neurological:     General: No focal deficit present.     Mental Status: He is alert and oriented to person, place, and time.     Cranial Nerves: No cranial nerve deficit.     Sensory: No sensory deficit.     Motor: Tremor (Bilateral upper extremities, worse with intentional movement. ) present. No weakness, abnormal muscle tone or seizure activity.     Coordination: Coordination is intact.  Psychiatric:        Mood and Affect: Mood normal.        Behavior: Behavior normal.     ED Results / Procedures / Treatments   Labs (all labs ordered are listed, but only abnormal results are displayed) Labs Reviewed  COMPREHENSIVE METABOLIC PANEL - Abnormal; Notable for the following components:      Result Value   Sodium 134 (*)    Chloride 94 (*)    Glucose, Bld 178 (*)    Total Protein 6.4 (*)    AST 186 (*)    ALT 173 (*)    Total Bilirubin 2.2 (*)    All other components within normal limits  CBC WITH DIFFERENTIAL/PLATELET - Abnormal; Notable for the following components:   Platelets 65 (*)    Lymphs Abs 0.4 (*)    All other components within normal limits  SARS CORONAVIRUS 2 (TAT 6-24 HRS)  ETHANOL  RAPID URINE DRUG SCREEN, HOSP PERFORMED    EKG EKG Interpretation  Date/Time:  Thursday November 29 2019 17:48:36 EST Ventricular Rate:  80 PR Interval:    QRS Duration: 76 QT Interval:  464 QTC Calculation: 536 R Axis:   51 Text Interpretation: Accelerated junctional rhythm ST elev, probable normal early repol pattern Prolonged QT interval Confirmed by Gerlene Fee [phone]) on 11/29/2019 6:17:06 PM   Radiology DG Chest Portable 1 View  Result Date: 11/29/2019 CLINICAL DATA:  Seizure lasting 30-45 seconds. Patient  complaining of chest pain yesterday. Possible alcohol involvement. EXAM: PORTABLE CHEST 1 VIEW COMPARISON:  None. FINDINGS: The heart size and mediastinal contours are within normal limits. The lungs are clear. No pneumothorax or large pleural effusion. The visualized skeletal structures are unremarkable. IMPRESSION: No acute cardiopulmonary finding. Electronically Signed   By: Audie Pinto M.D.   On: 11/29/2019 18:28    Procedures Procedures (including critical care time)  Medications Ordered in ED Medications  LORazepam (ATIVAN) injection 0-4 mg (1 mg Intravenous Given 11/29/19 2134)  Or  LORazepam (ATIVAN) tablet 0-4 mg ( Oral See Alternative 11/29/19 2134)  LORazepam (ATIVAN) injection 0-4 mg (has no administration in time range)    Or  LORazepam (ATIVAN) tablet 0-4 mg (has no administration in time range)  thiamine tablet 100 mg ( Oral See Alternative 11/29/19 1836)    Or  thiamine (B-1) injection 100 mg (100 mg Intravenous Given 11/29/19 1836)    ED Course  I have reviewed the triage vital signs and the nursing notes.  Pertinent labs & imaging results that were available during my care of the patient were reviewed by me and considered in my medical decision making (see chart for details).  Clinical Course as of Nov 28 2205  Thu Nov 29, 2019  1920 Stretched out and yelled, 30-45 seconds, Blue for about 1 minute last drink was 10 pm, "Last time he had one was April, Sunday he didn't have anything to drink and went to bathroom ad his hands were shaking    [EH]  2206 Spoke with Dr. Precious ReelKrakrakandy who will see patient.   [EH]    Clinical Course User Index [EH] Norman ClayHammond, Vianey Caniglia W, PA-C   MDM Rules/Calculators/A&P                     Norberta KeensJohn M Griesinger presents today for evaluation of witnessed seizure activity.  He has a history of prior alcohol withdrawal seizures.  His last drink was yesterday.  On arrival he is ANO x3.  He did not fall or have a significant head trauma  therefore CT head was not obtained.  Labs are obtained, appear consistent with reported baseline alcohol usage showing mild transaminitis.    Given that patient has a history of multiple alcohol withdrawal seizures in the past will pursue admission.  I spoke with Dr. Precious ReelKrakrakandy who will see the patient for admission.   Patient remained hemodynamically stable while in my care.  Note: Portions of this report may have been transcribed using voice recognition software. Every effort was made to ensure accuracy; however, inadvertent computerized transcription errors may be present  Final Clinical Impression(s) / ED Diagnoses Final diagnoses:  Alcohol withdrawal seizure without complication [facility](HCC)    Rx / DC Orders ED Discharge Orders    None       Cristina GongHammond, Latrel Szymczak W, PA-C 11/30/19 0109    Sabas SousBero, Michael M, MD 11/30/19 1750

## 2019-11-29 NOTE — ED Triage Notes (Signed)
Pt BIB [facility]. Pt was riding around with his girlfriend when he had a seizure. Pt's girlfriend stated it lasted about 30-45 secs. Pt has a hx of seizures with the last one noted in April. Pt's girlfriend was initially not concerned but stated he turned blue so she called EMS. Pt endorses drinking alcohol and this sometimes happens with alcohol withdrawal. Pt states his last drink was yesterday. EMS stated pt's HR was initially 170s but only lasted a min or two and returned to sinus rhythm with HR between 95-104. Pt also endorses chest pain that started around noon.

## 2019-11-30 ENCOUNTER — Observation Stay (HOSPITAL_COMMUNITY): Payer: No Typology Code available for payment source

## 2019-11-30 ENCOUNTER — Encounter (HOSPITAL_COMMUNITY): Payer: Self-pay | Admitting: Internal Medicine

## 2019-11-30 ENCOUNTER — Other Ambulatory Visit: Payer: Self-pay

## 2019-11-30 DIAGNOSIS — Z7901 Long term (current) use of anticoagulants: Secondary | ICD-10-CM | POA: Diagnosis not present

## 2019-11-30 DIAGNOSIS — R9431 Abnormal electrocardiogram [ECG] [EKG]: Secondary | ICD-10-CM | POA: Diagnosis present

## 2019-11-30 DIAGNOSIS — R569 Unspecified convulsions: Secondary | ICD-10-CM

## 2019-11-30 DIAGNOSIS — R05 Cough: Secondary | ICD-10-CM | POA: Diagnosis present

## 2019-11-30 DIAGNOSIS — Z20822 Contact with and (suspected) exposure to covid-19: Secondary | ICD-10-CM | POA: Diagnosis present

## 2019-11-30 DIAGNOSIS — F10230 Alcohol dependence with withdrawal, uncomplicated: Secondary | ICD-10-CM | POA: Diagnosis not present

## 2019-11-30 DIAGNOSIS — E871 Hypo-osmolality and hyponatremia: Secondary | ICD-10-CM | POA: Diagnosis present

## 2019-11-30 DIAGNOSIS — R7989 Other specified abnormal findings of blood chemistry: Secondary | ICD-10-CM

## 2019-11-30 DIAGNOSIS — D6959 Other secondary thrombocytopenia: Secondary | ICD-10-CM | POA: Diagnosis present

## 2019-11-30 DIAGNOSIS — F17200 Nicotine dependence, unspecified, uncomplicated: Secondary | ICD-10-CM | POA: Diagnosis present

## 2019-11-30 DIAGNOSIS — R079 Chest pain, unspecified: Secondary | ICD-10-CM | POA: Diagnosis present

## 2019-11-30 DIAGNOSIS — Z79899 Other long term (current) drug therapy: Secondary | ICD-10-CM | POA: Diagnosis not present

## 2019-11-30 DIAGNOSIS — D696 Thrombocytopenia, unspecified: Secondary | ICD-10-CM | POA: Diagnosis present

## 2019-11-30 DIAGNOSIS — F10239 Alcohol dependence with withdrawal, unspecified: Secondary | ICD-10-CM | POA: Diagnosis present

## 2019-11-30 DIAGNOSIS — R739 Hyperglycemia, unspecified: Secondary | ICD-10-CM | POA: Diagnosis present

## 2019-11-30 DIAGNOSIS — E860 Dehydration: Secondary | ICD-10-CM | POA: Diagnosis present

## 2019-11-30 LAB — BASIC METABOLIC PANEL
Anion gap: 10 (ref 5–15)
BUN: 11 mg/dL (ref 6–20)
CO2: 30 mmol/L (ref 22–32)
Calcium: 9.7 mg/dL (ref 8.9–10.3)
Chloride: 96 mmol/L — ABNORMAL LOW (ref 98–111)
Creatinine, Ser: 0.82 mg/dL (ref 0.61–1.24)
GFR calc Af Amer: >60 mL/min (ref >60)
GFR calc non Af Amer: >60 mL/min (ref >60)
Glucose, Bld: 92 mg/dL (ref 70–99)
Potassium: 3.9 mmol/L (ref 3.5–5.1)
Sodium: 136 mmol/L (ref 135–145)

## 2019-11-30 LAB — CBC WITH DIFFERENTIAL/PLATELET
Abs Immature Granulocytes: 0.01 10*3/uL (ref 0.00–0.07)
Basophils Absolute: 0 10*3/uL (ref 0.0–0.1)
Basophils Relative: 1 %
Eosinophils Absolute: 0.1 10*3/uL (ref 0.0–0.5)
Eosinophils Relative: 2 %
HCT: 42.6 % (ref 39.0–52.0)
Hemoglobin: 14.5 g/dL (ref 13.0–17.0)
Immature Granulocytes: 0 %
Lymphocytes Relative: 21 %
Lymphs Abs: 1 10*3/uL (ref 0.7–4.0)
MCH: 31.4 pg (ref 26.0–34.0)
MCHC: 34 g/dL (ref 30.0–36.0)
MCV: 92.2 fL (ref 80.0–100.0)
Monocytes Absolute: 0.5 10*3/uL (ref 0.1–1.0)
Monocytes Relative: 11 %
Neutro Abs: 3.2 10*3/uL (ref 1.7–7.7)
Neutrophils Relative %: 65 %
Platelets: 69 10*3/uL — ABNORMAL LOW (ref 150–400)
RBC: 4.62 MIL/uL (ref 4.22–5.81)
RDW: 13.8 % (ref 11.5–15.5)
WBC: 4.8 10*3/uL (ref 4.0–10.5)
nRBC: 0 % (ref 0.0–0.2)

## 2019-11-30 LAB — HEPATIC FUNCTION PANEL
ALT: 144 U/L — ABNORMAL HIGH (ref 0–44)
AST: 139 U/L — ABNORMAL HIGH (ref 15–41)
Albumin: 3.8 g/dL (ref 3.5–5.0)
Alkaline Phosphatase: 59 U/L (ref 38–126)
Bilirubin, Direct: 0.3 mg/dL — ABNORMAL HIGH (ref 0.0–0.2)
Indirect Bilirubin: 2 mg/dL — ABNORMAL HIGH (ref 0.3–0.9)
Total Bilirubin: 2.3 mg/dL — ABNORMAL HIGH (ref 0.3–1.2)
Total Protein: 6.1 g/dL — ABNORMAL LOW (ref 6.5–8.1)

## 2019-11-30 LAB — SARS CORONAVIRUS 2 (TAT 6-24 HRS): SARS Coronavirus 2: NEGATIVE

## 2019-11-30 LAB — HEMOGLOBIN A1C
Hgb A1c MFr Bld: 5.4 % (ref 4.8–5.6)
Mean Plasma Glucose: 108.28 mg/dL

## 2019-11-30 LAB — TROPONIN I (HIGH SENSITIVITY): Troponin I (High Sensitivity): 43 ng/L — ABNORMAL HIGH (ref <18)

## 2019-11-30 LAB — HIV ANTIBODY (ROUTINE TESTING W REFLEX): HIV Screen 4th Generation wRfx: NONREACTIVE

## 2019-11-30 LAB — MAGNESIUM: Magnesium: 1.6 mg/dL — ABNORMAL LOW (ref 1.7–2.4)

## 2019-11-30 MED ORDER — ACETAMINOPHEN 650 MG RE SUPP: 650.0000 mg | Freq: Four times a day (QID) | RECTAL | Status: DC | PRN

## 2019-11-30 MED ORDER — FOLIC ACID 1 MG PO TABS
1.0000 mg | ORAL_TABLET | Freq: Every day | ORAL | Status: DC
  Administered 2019-11-30 – 2019-12-01 (×2): 1 mg via ORAL
  Filled 2019-11-30 (×2): qty 1

## 2019-11-30 MED ORDER — LORAZEPAM 2 MG/ML IJ SOLN
1.0000 mg | INTRAMUSCULAR | Status: DC | PRN
  Administered 2019-11-30: 01:00:00 1 mg via INTRAVENOUS
  Filled 2019-11-30: qty 1

## 2019-11-30 MED ORDER — SODIUM CHLORIDE 0.9 % IV SOLN: INTRAVENOUS | Status: DC

## 2019-11-30 MED ORDER — THIAMINE HCL 100 MG/ML IJ SOLN
100.0000 mg | Freq: Every day | INTRAMUSCULAR | Status: DC
  Filled 2019-11-30: qty 2

## 2019-11-30 MED ORDER — LORAZEPAM 1 MG PO TABS: 1.0000 mg | ORAL_TABLET | ORAL | Status: DC | PRN

## 2019-11-30 MED ORDER — ONDANSETRON HCL 4 MG/2ML IJ SOLN: 4.0000 mg | Freq: Four times a day (QID) | INTRAMUSCULAR | Status: DC | PRN

## 2019-11-30 MED ORDER — LORAZEPAM 1 MG PO TABS: 0.0000 mg | ORAL_TABLET | Freq: Four times a day (QID) | ORAL | Status: DC

## 2019-11-30 MED ORDER — THIAMINE HCL 100 MG PO TABS
100.0000 mg | ORAL_TABLET | Freq: Every day | ORAL | Status: DC
  Administered 2019-11-30 – 2019-12-01 (×2): 100 mg via ORAL
  Filled 2019-11-30 (×2): qty 1

## 2019-11-30 MED ORDER — MAGNESIUM SULFATE 2 GM/50ML IV SOLN
2.0000 g | Freq: Once | INTRAVENOUS | Status: AC
  Administered 2019-11-30: 2 g via INTRAVENOUS
  Filled 2019-11-30: qty 50

## 2019-11-30 MED ORDER — ADULT MULTIVITAMIN W/MINERALS CH
1.0000 | ORAL_TABLET | Freq: Every day | ORAL | Status: DC
  Administered 2019-11-30 – 2019-12-01 (×2): 1 via ORAL
  Filled 2019-11-30 (×2): qty 1

## 2019-11-30 MED ORDER — LORAZEPAM 1 MG PO TABS: 0.0000 mg | ORAL_TABLET | Freq: Two times a day (BID) | ORAL | Status: DC

## 2019-11-30 MED ORDER — ONDANSETRON HCL 4 MG PO TABS: 4.0000 mg | ORAL_TABLET | Freq: Four times a day (QID) | ORAL | Status: DC | PRN

## 2019-11-30 MED ORDER — ACETAMINOPHEN 325 MG PO TABS: 650.0000 mg | ORAL_TABLET | Freq: Four times a day (QID) | ORAL | Status: DC | PRN

## 2019-11-30 NOTE — Plan of Care (Signed)
  Problem: Activity: Goal: Risk for activity intolerance will decrease Outcome: Progressing   Problem: Safety: Goal: Ability to remain free from injury will improve Outcome: Progressing   

## 2019-11-30 NOTE — H&P (Addendum)
History and Physical    Sean Fields NUU:[id number] DOB: [birth year] DOA: 11/29/2019  PCP: [facility]  Patient coming from: Home.  Chief Complaint: Seizures.  HPI: Sean Fields is a 39 y.o. male with history of alcoholism was found to have a generalized tonic-clonic seizure by patient's girlfriend while patient was riding with her.  Initially per the report patient was having a seizure but when patient started becoming cyanotic patient's girlfriend called EMS.  Per the report patient's seizure lasted from 30 to 40 seconds and was confused after the incident.  Patient states he has been trying to cut down his alcohol and had no alcohol last 2 days.  Denies any headache or any focal deficits.  Denies any fall.  Patient states he did bite his tongue and had bit his time before when he had seizures.  Usually has seizures when he tries to cut down the alcohol.  ED Course: In the ER EKG shows normal sinus rhythm with prolonged QTC of 536 ms with nonspecific ST-T changes.  Patient did complain of some chest pain prior to the incident of seizure per the report.  But at the time of my exam patient denies any chest pain.  Chest x-ray unremarkable CT head unremarkable.  Covid test was negative.  Urine drug screen negative.  Patient admitted for further observation of alcohol withdrawal.  Labs were significant for AST of 186 ALT 174 platelets of 65.  Sodium was 134 chloride 94.  Review of Systems: As per HPI, rest all negative.   Past Medical History:  Diagnosis Date  . Seizures ([place])    due to alcohol withdrawal    Past Surgical History:  Procedure Laterality Date  . HERNIA REPAIR       reports that he has been smoking. He has never used smokeless tobacco. He reports current alcohol use. He reports that he does not use drugs.  Allergies  Allergen Reactions  . Smallpox Vaccine Other (See Comments)    unkown    Family History  Family history unknown: Yes    Prior to Admission  medications   Medication Sig Start Date End Date Taking? Authorizing Provider  chlordiazePOXIDE (LIBRIUM) 25 MG capsule 50mg  PO TID x 1D, then 25-50mg  PO BID X 1D, then 25-50mg  PO QD X 1D Patient not taking: Reported on 11/29/2019 04/28/19   Deno Etienne, DO  diltiazem (CARDIZEM CD) 120 MG 24 hr capsule Take 1 capsule (120 mg total) by mouth daily. Patient not taking: Reported on 12/11/2018 12/07/18   Domenic Moras, PA-C  ELIQUIS STARTER PACK (ELIQUIS STARTER PACK) 5 MG TABS Take as directed on package: start with two-5mg  tablets twice daily for 7 days. On day 8, switch to one-5mg  tablet twice daily. Patient not taking: Reported on 12/11/2018 12/07/18   Domenic Moras, PA-C    Physical Exam: Constitutional: Moderately built and nourished.   Vitals:   11/29/19 2315 11/29/19 2330 11/29/19 2345 11/30/19 0000  BP: (!) 139/93 137/87 (!) 136/92 140/82  Pulse: 86 87 85 94  Resp: 15 17 17 18   Temp:      TempSrc:      SpO2: 96% 95% 96% 93%  Weight:      Height:       Eyes: Anicteric no pallor. ENMT: No discharge from the ears eyes nose or mouth. Neck: No mass felt.  No neck rigidity. Respiratory: No rhonchi or crepitations. Cardiovascular: S1-S2 heard. Abdomen: Soft nontender bowel sounds present. Musculoskeletal: No edema.  No  joint effusion. Skin: No rash. Neurologic: Alert awake oriented time place and person.  Moves all extremities. Psychiatric: Appears normal per normal affect.   Labs on Admission: I have personally reviewed following labs and imaging studies  CBC: Recent Labs  Lab 11/29/19 1833  WBC 5.2  NEUTROABS 4.3  HGB 14.2  HCT 41.3  MCV 91.4  PLT 65*   Basic Metabolic Panel: Recent Labs  Lab 11/29/19 1833  NA 134*  K 4.5  CL 94*  CO2 27  GLUCOSE 178*  BUN 12  CREATININE 0.94  CALCIUM 9.5   GFR: Estimated Creatinine Clearance: 99.6 mL/min (by C-G formula based on SCr of 0.94 mg/dL). Liver Function Tests: Recent Labs  Lab 11/29/19 1833  AST 186*  ALT 173*   ALKPHOS 54  BILITOT 2.2*  PROT 6.4*  ALBUMIN 4.1   No results for input(s): LIPASE, AMYLASE in the last 168 hours. No results for input(s): AMMONIA in the last 168 hours. Coagulation Profile: No results for input(s): INR, PROTIME in the last 168 hours. Cardiac Enzymes: No results for input(s): CKTOTAL, CKMB, CKMBINDEX, TROPONINI in the last 168 hours. BNP (last 3 results) No results for input(s): PROBNP in the last 8760 hours. HbA1C: No results for input(s): HGBA1C in the last 72 hours. CBG: No results for input(s): GLUCAP in the last 168 hours. Lipid Profile: No results for input(s): CHOL, HDL, LDLCALC, TRIG, CHOLHDL, LDLDIRECT in the last 72 hours. Thyroid Function Tests: No results for input(s): TSH, T4TOTAL, FREET4, T3FREE, THYROIDAB in the last 72 hours. Anemia Panel: No results for input(s): VITAMINB12, FOLATE, FERRITIN, TIBC, IRON, RETICCTPCT in the last 72 hours. Urine analysis:    Component Value Date/Time   COLORURINE STRAW (A) 12/07/2018 1013   APPEARANCEUR CLEAR 12/07/2018 1013   LABSPEC 1.005 12/07/2018 1013   PHURINE 8.0 12/07/2018 1013   GLUCOSEU NEGATIVE 12/07/2018 1013   HGBUR NEGATIVE 12/07/2018 1013   BILIRUBINUR NEGATIVE 12/07/2018 1013   KETONESUR 5 (A) 12/07/2018 1013   PROTEINUR NEGATIVE 12/07/2018 1013   NITRITE NEGATIVE 12/07/2018 1013   LEUKOCYTESUR NEGATIVE 12/07/2018 1013   Sepsis Labs: @LABRCNTIP (procalcitonin:4,lacticidven:4) )No results found for this or any previous visit (from the past 240 hour(s)).   Radiological Exams on Admission: DG Chest Portable 1 View  Result Date: 11/29/2019 CLINICAL DATA:  Seizure lasting 30-45 seconds. Patient complaining of chest pain yesterday. Possible alcohol involvement. EXAM: PORTABLE CHEST 1 VIEW COMPARISON:  None. FINDINGS: The heart size and mediastinal contours are within normal limits. The lungs are clear. No pneumothorax or large pleural effusion. The visualized skeletal structures are  unremarkable. IMPRESSION: No acute cardiopulmonary finding. Electronically Signed   By: 12/01/2019 M.D.   On: 11/29/2019 18:28    EKG: Independently reviewed.  Normal sinus rhythm with prolonged QTC.  Assessment/Plan Principal Problem:   Alcohol withdrawal (HCC) Active Problems:   Elevated LFTs    1. Alcohol withdrawal with seizures presently being observed with CIWA protocol.  Advised about quitting alcohol.  Social work consult. 2. Elevated LFTs consistent with alcoholism.  Follow LFTs and further work-up as outpatient. 3. Prolonged QTC avoid QT prolonging medication check magnesium and potassium. 4. Patient did complain of some chest pain to the ER staff.  At this time has no chest pain will cycle cardiac markers. 5. Previous history of atrial fibrillation triggered by alcohol had followed up with EP cardiology and at that time was prescribed Cardizem which patient at this time but not taking since patient's chads 2 vasc score of 0  no anticoagulation was prescribed at the time.  Presently EKG shows sinus rhythm. 6. Mild hyponatremia likely from dehydration.  Follow metabolic panel. 7. Hyperglycemia check hemoglobin A1c. 8. Thrombocytopenia likely from alcoholism follow CBC.   DVT prophylaxis: SCDs has thrombocytopenia. Code Status: Full code. Family Communication: Discussed with patient. Disposition Plan: Home. Consults called: Child psychotherapist. Admission status: Observation.   Eduard Clos MD [facility] Pager [phone].  If 7PM-7AM, please contact night-coverage [email or web address] Password [email or web address]  11/30/2019, 12:20 AM

## 2019-11-30 NOTE — ED Notes (Signed)
ED TO INPATIENT HANDOFF REPORT  ED Nurse Name and Phone #: Lowanda Foster [phone]  S Name/Age/Gender Sean Fields 39 y.o. male Room/Bed: 028C/028C  Code Status   Code Status: Full Code  Home/SNF/Other Home Patient oriented to: self, place, time and situation Is this baseline? Yes   Triage Complete: Triage complete  Chief Complaint Alcohol withdrawal (HCC) [F10.239]  Triage Note Pt BIB [facility]. Pt was riding around with his girlfriend when he had a seizure. Pt's girlfriend stated it lasted about 30-45 secs. Pt has a hx of seizures with the last one noted in April. Pt's girlfriend was initially not concerned but stated he turned blue so she called EMS. Pt endorses drinking alcohol and this sometimes happens with alcohol withdrawal. Pt states his last drink was yesterday. EMS stated pt's HR was initially 170s but only lasted a min or two and returned to sinus rhythm with HR between 95-104. Pt also endorses chest pain that started around noon.      Allergies Allergies  Allergen Reactions  . Smallpox Vaccine Other (See Comments)    unkown    Level of Care/Admitting Diagnosis ED Disposition    ED Disposition Condition Comment   Admit  Hospital Area: [facility] [[id number]]  Level of Care: Telemetry Medical [104]  I expect the patient will be discharged within 24 hours: No (not a candidate for 5C-Observation unit)  Covid Evaluation: Asymptomatic Screening Protocol (No Symptoms)  Diagnosis: Alcohol withdrawal (HCC) [291.81.ICD-9-CM]  Admitting Physician: Eduard Clos [phone]  Attending Physician: Eduard Clos [phone]  PT Class (Do Not Modify): Observation [104]  PT Acc Code (Do Not Modify): Observation [10022]       B Medical/Surgery History Past Medical History:  Diagnosis Date  . Seizures (HCC)    due to alcohol withdrawal   Past Surgical History:  Procedure Laterality Date  . HERNIA REPAIR       A IV Location/Drains/Wounds Patient  Lines/Drains/Airways Status   Active Line/Drains/Airways    Name:   Placement date:   Placement time:   Site:   Days:   Peripheral IV 11/29/19 Right Antecubital   11/29/19    1752    Antecubital   1          Intake/Output Last 24 hours No intake or output data in the 24 hours ending 11/30/19 0055  Labs/Imaging Results for orders placed or performed during the hospital encounter of 11/29/19 (from the past 48 hour(s))  Comprehensive metabolic panel     Status: Abnormal   Collection Time: 11/29/19  6:33 PM  Result Value Ref Range   Sodium 134 (L) 135 - 145 mmol/L   Potassium 4.5 3.5 - 5.1 mmol/L    Comment: SLIGHT HEMOLYSIS   Chloride 94 (L) 98 - 111 mmol/L   CO2 27 22 - 32 mmol/L   Glucose, Bld 178 (H) 70 - 99 mg/dL   BUN 12 6 - 20 mg/dL   Creatinine, Ser 3.32 0.61 - 1.24 mg/dL   Calcium 9.5 8.9 - 95.1 mg/dL   Total Protein 6.4 (L) 6.5 - 8.1 g/dL   Albumin 4.1 3.5 - 5.0 g/dL   AST 884 (H) 15 - 41 U/L    Comment: SLIGHT HEMOLYSIS   ALT 173 (H) 0 - 44 U/L    Comment: SLIGHT HEMOLYSIS   Alkaline Phosphatase 54 38 - 126 U/L    Comment: SLIGHT HEMOLYSIS   Total Bilirubin 2.2 (H) 0.3 - 1.2 mg/dL    Comment: SLIGHT HEMOLYSIS  GFR calc non Af Amer >60 >60 mL/min   GFR calc Af Amer >60 >60 mL/min   Anion gap 13 5 - 15    Comment: Performed at [facility], [address], [place], [place] [address]  Ethanol     Status: None   Collection Time: 11/29/19  6:33 PM  Result Value Ref Range   Alcohol, Ethyl (B) <10 <10 mg/dL    Comment: (NOTE) Lowest detectable limit for serum alcohol is 10 mg/dL. For medical purposes only. Performed at [facility], [address], [place], [place] [address]   CBC with Differential     Status: Abnormal   Collection Time: 11/29/19  6:33 PM  Result Value Ref Range   WBC 5.2 4.0 - 10.5 K/uL   RBC 4.52 4.22 - 5.81 MIL/uL   Hemoglobin 14.2 13.0 - 17.0 g/dL   HCT 45.6 25.6 - 38.9 %   MCV 91.4 80.0 - 100.0 fL   MCH 31.4 26.0 - 34.0 pg    MCHC 34.4 30.0 - 36.0 g/dL   RDW 37.3 42.8 - 76.8 %   Platelets 65 (L) 150 - 400 K/uL    Comment: REPEATED TO VERIFY PLATELET COUNT CONFIRMED BY SMEAR Immature Platelet Fraction may be clinically indicated, consider ordering this additional test [id number]    nRBC 0.0 0.0 - 0.2 %   Neutrophils Relative % 84 %   Neutro Abs 4.3 1.7 - 7.7 K/uL   Lymphocytes Relative 9 %   Lymphs Abs 0.4 (L) 0.7 - 4.0 K/uL   Monocytes Relative 7 %   Monocytes Absolute 0.4 0.1 - 1.0 K/uL   Eosinophils Relative 0 %   Eosinophils Absolute 0.0 0.0 - 0.5 K/uL   Basophils Relative 0 %   Basophils Absolute 0.0 0.0 - 0.1 K/uL   Immature Granulocytes 0 %   Abs Immature Granulocytes 0.02 0.00 - 0.07 K/uL    Comment: Performed at [facility], [address], [place], [place] [address]  Urine rapid drug screen (hosp performed)     Status: None   Collection Time: 11/29/19  9:38 PM  Result Value Ref Range   Opiates NONE DETECTED NONE DETECTED   Cocaine NONE DETECTED NONE DETECTED   Benzodiazepines NONE DETECTED NONE DETECTED   Amphetamines NONE DETECTED NONE DETECTED   Tetrahydrocannabinol NONE DETECTED NONE DETECTED   Barbiturates NONE DETECTED NONE DETECTED    Comment: (NOTE) DRUG SCREEN FOR MEDICAL PURPOSES ONLY.  IF CONFIRMATION IS NEEDED FOR ANY PURPOSE, NOTIFY LAB WITHIN 5 DAYS. LOWEST DETECTABLE LIMITS FOR URINE DRUG SCREEN Drug Class                     Cutoff (ng/mL) Amphetamine and metabolites    1000 Barbiturate and metabolites    200 Benzodiazepine                 200 Tricyclics and metabolites     300 Opiates and metabolites        300 Cocaine and metabolites        300 THC                            50 Performed at [facility], [address], [place], [place] [address]    CT HEAD WO CONTRAST  Result Date: 11/30/2019 CLINICAL DATA:  Initial evaluation for acute seizure. EXAM: CT HEAD WITHOUT CONTRAST TECHNIQUE: Contiguous axial images were  obtained from the base of the  skull through the vertex without intravenous contrast. COMPARISON:  None. FINDINGS: Brain: Cerebral volume within normal limits for patient age. No evidence for acute intracranial hemorrhage. No findings to suggest acute large vessel territory infarct. No mass lesion, midline shift, or mass effect. Ventricles are normal in size without evidence for hydrocephalus. No extra-axial fluid collection identified. Vascular: No hyperdense vessel identified. Skull: Scalp soft tissues demonstrate no acute abnormality. Calvarium intact. Sinuses/Orbits: Globes and orbital soft tissues within normal limits. Mild scattered mucosal thickening noted within the ethmoidal air cells and maxillary sinuses. Paranasal sinuses are otherwise clear. No mastoid effusion. IMPRESSION: Negative head CT.  No acute intracranial abnormality identified. Electronically Signed   By: Rise Mu M.D.   On: 11/30/2019 00:38   DG Chest Portable 1 View  Result Date: 11/29/2019 CLINICAL DATA:  Seizure lasting 30-45 seconds. Patient complaining of chest pain yesterday. Possible alcohol involvement. EXAM: PORTABLE CHEST 1 VIEW COMPARISON:  None. FINDINGS: The heart size and mediastinal contours are within normal limits. The lungs are clear. No pneumothorax or large pleural effusion. The visualized skeletal structures are unremarkable. IMPRESSION: No acute cardiopulmonary finding. Electronically Signed   By: Emmaline Kluver M.D.   On: 11/29/2019 18:28    Pending Labs Unresulted Labs (From admission, onward)    Start     Ordered   11/30/19 0019  HIV Antibody (routine testing w rflx)  (HIV Antibody (Routine testing w reflex) panel)  Once,   STAT     11/30/19 0019   11/29/19 2154  SARS CORONAVIRUS 2 (TAT 6-24 HRS) Nasopharyngeal Nasopharyngeal Swab  (Tier 3 (TAT 6-24 hrs))  Once,   STAT    Question Answer Comment  Is this test for diagnosis or screening Screening   Symptomatic for COVID-19 as defined by CDC No   Hospitalized for  COVID-19 No   Admitted to ICU for COVID-19 No   Previously tested for COVID-19 No   Resident in a congregate (group) care setting No   Employed in healthcare setting No      11/29/19 2154          Vitals/Pain Today's Vitals   11/29/19 2330 11/29/19 2345 11/30/19 0000 11/30/19 0044  BP: 137/87 (!) 136/92 140/82 132/77  Pulse: 87 85 94 78  Resp: 17 17 18    Temp:      TempSrc:      SpO2: 95% 96% 93%   Weight:      Height:      PainSc:        Isolation Precautions No active isolations  Medications Medications  acetaminophen (TYLENOL) tablet 650 mg (has no administration in time range)    Or  acetaminophen (TYLENOL) suppository 650 mg (has no administration in time range)  ondansetron (ZOFRAN) tablet 4 mg (has no administration in time range)    Or  ondansetron (ZOFRAN) injection 4 mg (has no administration in time range)  0.9 %  sodium chloride infusion ( Intravenous New Bag/Given 11/30/19 0043)  LORazepam (ATIVAN) tablet 1-4 mg ( Oral See Alternative 11/30/19 0051)    Or  LORazepam (ATIVAN) injection 1-4 mg (1 mg Intravenous Given 11/30/19 0051)  thiamine tablet 100 mg (has no administration in time range)    Or  thiamine (B-1) injection 100 mg (has no administration in time range)  folic acid (FOLVITE) tablet 1 mg (has no administration in time range)  multivitamin with minerals tablet 1 tablet (has no administration in time range)  LORazepam (ATIVAN)  tablet 0-4 mg (0 mg Oral Not Given 11/30/19 0052)    Followed by  LORazepam (ATIVAN) tablet 0-4 mg (has no administration in time range)    Mobility walks Moderate fall risk   Focused Assessments Cardiac Assessment Handoff:  Cardiac Rhythm: Normal sinus rhythm No results found for: CKTOTAL, CKMB, CKMBINDEX, TROPONINI No results found for: DDIMER Does the Patient currently have chest pain? No   , Neuro Assessment Handoff:  Swallow screen pass? N/A Cardiac Rhythm: Normal sinus rhythm       Neuro Assessment:  Within Defined Limits Neuro Checks:      Last Documented NIHSS Modified Score:   Has TPA been given? No If patient is a Neuro Trauma and patient is going to OR before floor call report to [place] nurse: [phone] or [phone]     R Recommendations: See Admitting Provider Note  Report given to:   Additional Notes:

## 2019-11-30 NOTE — Progress Notes (Addendum)
[facility] PROGRESS NOTE  Sean Fields CWC:[id number] DOB: December 22, 1980 DOA: 11/29/2019 PCP: [facility]  Assessment/Plan:  1. Alcohol withdrawal with seizures. Reports no ETOH for 2 days. Reports he drinks a 6 pack daily and pint of vodka as well. Advised about quitting alcohol.  Social work consult. CIWA score 6. Ativan per protocol.  2. Elevated LFTs consistent with alcoholism.  LFTs trending down slightly. Work-up as outpatient. 3. Prolonged QTC avoid QT prolonging medication. Mag level 1.6. replete. Potassium 3.9. monitor. 4. Patient did complain of some chest pain to the ER staff. Denies chest pain this am.   At this time has no chest pain will cycle cardiac markers. One HS troponin 43. Likely related to seizure. No events on tele. monitor 5. Previous history of atrial fibrillation triggered by alcohol had followed up with EP cardiology and at that time was prescribed Cardizem which patient not taking since patient's chads 2 vasc score of 0 no anticoagulation. Presently EKG shows sinus rhythm. 6. Mild hyponatremia likely from dehydration. Resolved this am. Gentle IV fluids. monitor 7. Hyperglycemia. HgA1c 5.6. 8. Thrombocytopenia. Platelets 65. No s/sx bleeding.  likely from alcoholism follow CBC. monitor  Code Status: full  Family Communication: patient Disposition Plan: home hopefully 1-2 days   Consultants:    Procedures:    Antibiotics:    HPI/Subjective: Lying in bed. Aroused to verbal stimuli. Denies pain/discomfort.  Objective: Vitals:   11/30/19 0754 11/30/19 1149  BP: (!) 137/96 (!) 136/96  Pulse: 70 82  Resp: 16 18  Temp: 98.1 F (36.7 C) 98.6 F (37 C)  SpO2: 97% 98%    Intake/Output Summary (Last 24 hours) at 11/30/2019 1250 Last data filed at 11/30/2019 0943 Gross per 24 hour  Intake 853 ml  Output 450 ml  Net 403 ml   Filed Weights   11/29/19 1755 11/30/19 0123  Weight: 68 kg 73.9 kg    Exam:   General:  Awake alert no acute  distress quite disheveled   Cardiovascular: rrr no mgr no LE edema  Respiratory: normal effort BS clear no wheeze no rhonchi  Abdomen: non-distended +BS non-tender no guarding or rebounding  Musculoskeletal: joints without swelling/erythema   Data Reviewed: Basic Metabolic Panel: Recent Labs  Lab 11/29/19 1833 11/30/19 0554  NA 134* 136  K 4.5 3.9  CL 94* 96*  CO2 27 30  GLUCOSE 178* 92  BUN 12 11  CREATININE 0.94 0.82  CALCIUM 9.5 9.7  MG  --  1.6*   Liver Function Tests: Recent Labs  Lab 11/29/19 1833 11/30/19 0554  AST 186* 139*  ALT 173* 144*  ALKPHOS 54 59  BILITOT 2.2* 2.3*  PROT 6.4* 6.1*  ALBUMIN 4.1 3.8   No results for input(s): LIPASE, AMYLASE in the last 168 hours. No results for input(s): AMMONIA in the last 168 hours. CBC: Recent Labs  Lab 11/29/19 1833 11/30/19 0616  WBC 5.2 4.8  NEUTROABS 4.3 3.2  HGB 14.2 14.5  HCT 41.3 42.6  MCV 91.4 92.2  PLT 65* 69*   Cardiac Enzymes: No results for input(s): CKTOTAL, CKMB, CKMBINDEX, TROPONINI in the last 168 hours. BNP (last 3 results) No results for input(s): BNP in the last 8760 hours.  ProBNP (last 3 results) No results for input(s): PROBNP in the last 8760 hours.  CBG: No results for input(s): GLUCAP in the last 168 hours.  Recent Results (from the past 240 hour(s))  SARS CORONAVIRUS 2 (TAT 6-24 HRS) Nasopharyngeal Nasopharyngeal Swab  Status: None   Collection Time: 11/29/19 10:23 PM   Specimen: Nasopharyngeal Swab  Result Value Ref Range Status   SARS Coronavirus 2 NEGATIVE NEGATIVE Final    Comment: (NOTE) SARS-CoV-2 target nucleic acids are NOT DETECTED. The SARS-CoV-2 RNA is generally detectable in upper and lower respiratory specimens during the acute phase of infection. Negative results do not preclude SARS-CoV-2 infection, do not rule out co-infections with other pathogens, and should not be used as the sole basis for treatment or other patient management  decisions. Negative results must be combined with clinical observations, patient history, and epidemiological information. The expected result is Negative. Fact Sheet for Patients: HairSlick.no Fact Sheet for Healthcare Providers: quierodirigir.com This test is not yet approved or cleared by the [place] FDA and  has been authorized for detection and/or diagnosis of SARS-CoV-2 by FDA under an Emergency Use Authorization (EUA). This EUA will remain  in effect (meaning this test can be used) for the duration of the COVID-19 declaration under Section 56 4(b)(1) of the Act, 21 U.S.C. section 360bbb-3(b)(1), unless the authorization is terminated or revoked sooner. Performed at [facility], [address], [place], [place] [address]      Studies: CT HEAD WO CONTRAST  Result Date: 11/30/2019 CLINICAL DATA:  Initial evaluation for acute seizure. EXAM: CT HEAD WITHOUT CONTRAST TECHNIQUE: Contiguous axial images were obtained from the base of the skull through the vertex without intravenous contrast. COMPARISON:  None. FINDINGS: Brain: Cerebral volume within normal limits for patient age. No evidence for acute intracranial hemorrhage. No findings to suggest acute large vessel territory infarct. No mass lesion, midline shift, or mass effect. Ventricles are normal in size without evidence for hydrocephalus. No extra-axial fluid collection identified. Vascular: No hyperdense vessel identified. Skull: Scalp soft tissues demonstrate no acute abnormality. Calvarium intact. Sinuses/Orbits: Globes and orbital soft tissues within normal limits. Mild scattered mucosal thickening noted within the ethmoidal air cells and maxillary sinuses. Paranasal sinuses are otherwise clear. No mastoid effusion. IMPRESSION: Negative head CT.  No acute intracranial abnormality identified. Electronically Signed   By: Rise Mu M.D.   On: 11/30/2019 00:38    DG Chest Portable 1 View  Result Date: 11/29/2019 CLINICAL DATA:  Seizure lasting 30-45 seconds. Patient complaining of chest pain yesterday. Possible alcohol involvement. EXAM: PORTABLE CHEST 1 VIEW COMPARISON:  None. FINDINGS: The heart size and mediastinal contours are within normal limits. The lungs are clear. No pneumothorax or large pleural effusion. The visualized skeletal structures are unremarkable. IMPRESSION: No acute cardiopulmonary finding. Electronically Signed   By: Emmaline Kluver M.D.   On: 11/29/2019 18:28    Scheduled Meds: . folic acid  1 mg Oral Daily  . LORazepam  0-4 mg Oral Q6H   Followed by  . [START ON 12/02/2019] LORazepam  0-4 mg Oral Q12H  . multivitamin with minerals  1 tablet Oral Daily  . thiamine  100 mg Oral Daily   Or  . thiamine  100 mg Intravenous Daily   Continuous Infusions: . sodium chloride 100 mL/hr at 11/30/19 0043    Principal Problem:   Seizure (HCC) Active Problems:   Alcohol withdrawal (HCC)   Elevated LFTs   Thrombocytopenia (HCC)    Time spent: 45 minutes   [facility] M NP  [facility]  If 7PM-7AM, please contact night-coverage at [email or web address], password [facility] 11/30/2019, 12:50 PM  LOS: 0 days

## 2019-11-30 NOTE — Plan of Care (Signed)

## 2019-12-01 LAB — COMPREHENSIVE METABOLIC PANEL
ALT: 116 U/L — ABNORMAL HIGH (ref 0–44)
AST: 111 U/L — ABNORMAL HIGH (ref 15–41)
Albumin: 3.9 g/dL (ref 3.5–5.0)
Alkaline Phosphatase: 54 U/L (ref 38–126)
Anion gap: 11 (ref 5–15)
BUN: 8 mg/dL (ref 6–20)
CO2: 23 mmol/L (ref 22–32)
Calcium: 9.2 mg/dL (ref 8.9–10.3)
Chloride: 98 mmol/L (ref 98–111)
Creatinine, Ser: 0.76 mg/dL (ref 0.61–1.24)
GFR calc Af Amer: >60 mL/min (ref >60)
GFR calc non Af Amer: >60 mL/min (ref >60)
Glucose, Bld: 90 mg/dL (ref 70–99)
Potassium: 4 mmol/L (ref 3.5–5.1)
Sodium: 132 mmol/L — ABNORMAL LOW (ref 135–145)
Total Bilirubin: 1.9 mg/dL — ABNORMAL HIGH (ref 0.3–1.2)
Total Protein: 6.6 g/dL (ref 6.5–8.1)

## 2019-12-01 LAB — CBC
HCT: 42.2 % (ref 39.0–52.0)
Hemoglobin: 14.4 g/dL (ref 13.0–17.0)
MCH: 31.1 pg (ref 26.0–34.0)
MCHC: 34.1 g/dL (ref 30.0–36.0)
MCV: 91.1 fL (ref 80.0–100.0)
Platelets: 72 10*3/uL — ABNORMAL LOW (ref 150–400)
RBC: 4.63 MIL/uL (ref 4.22–5.81)
RDW: 13.5 % (ref 11.5–15.5)
WBC: 4.6 10*3/uL (ref 4.0–10.5)
nRBC: 0 % (ref 0.0–0.2)

## 2019-12-01 MED ORDER — CHLORDIAZEPOXIDE HCL 25 MG PO CAPS: ORAL_CAPSULE | ORAL | 0 refills | Status: DC

## 2019-12-01 NOTE — Discharge Summary (Signed)
Physician Discharge Summary  [name]:[id number] DOB: 07/29/1981 DOA: 11/29/2019  PCP: [facility]  Admit date: 11/29/2019 Discharge date: 12/01/2019  Admitted From: home Discharge disposition: home   Recommendations for Outpatient Follow-Up:   1. encouraged alcohol rehab-- patient says he will follow at the [place] 2. Driving restrictions   Discharge Diagnosis:   Principal Problem:   Seizure (HCC) Active Problems:   Alcohol withdrawal (HCC)   Elevated LFTs   Thrombocytopenia (HCC)    Discharge Condition: Improved.  Diet recommendation: Low sodium, heart healthy.  Wound care: None.  Code status: Full.   History of Present Illness:   Sean Fields is a 39 y.o. male with history of alcoholism was found to have a generalized tonic-clonic seizure by patient's girlfriend while patient was riding with her.  Initially per the report patient was having a seizure but when patient started becoming cyanotic patient's girlfriend called EMS.  Per the report patient's seizure lasted from 30 to 40 seconds and was confused after the incident.  Patient states he has been trying to cut down his alcohol and had no alcohol last 2 days.  Denies any headache or any focal deficits.  Denies any fall.  Patient states he did bite his tongue and had bit his time before when he had seizures.  Usually has seizures when he tries to cut down the alcohol.   Hospital Course by Problem:   1. Alcohol withdrawal with seizures- no symptoms of withdrawal 2. Elevated LFTs consistent with alcoholism.  LFTs trending down slightly. Encouraged continued cessation 3. Prolonged QTC avoid QT prolonging medication. Mag level 1.6. replete. Potassium 3.9. monitor. 4. Previous history of atrial fibrillation triggered by alcohol had followed up with EP cardiology and at that time was prescribed Cardizem which patient not taking since patient's chads 2 vasc score of 0 no anticoagulation.Presently EKG  shows sinus rhythm. 5. Mild hyponatremia likely from dehydration. Resolved  6. Hyperglycemia. HgA1c 5.6. 7. Thrombocytopenia. Platelets 65. No s/sx bleeding.  likely from alcoholism    Medical Consultants:      Discharge Exam:   Vitals:   12/01/19 0447 12/01/19 0901  BP: (!) 129/95 (!) 127/98  Pulse: 60 77  Resp: 18 18  Temp: 98 F (36.7 C) 98.4 F (36.9 C)  SpO2: 100% 98%   Vitals:   11/30/19 2045 12/01/19 0054 12/01/19 0447 12/01/19 0901  BP: (!) 131/99 (!) 133/106 (!) 129/95 (!) 127/98  Pulse: 90 79 60 77  Resp: 16 18 18 18   Temp: 99.1 F (37.3 C) 98.5 F (36.9 C) 98 F (36.7 C) 98.4 F (36.9 C)  TempSrc: Oral Oral Oral Oral  SpO2: 98% 99% 100% 98%  Weight:   74 kg   Height:        General exam: Appears calm and comfortable.   The results of significant diagnostics from this hospitalization (including imaging, microbiology, ancillary and laboratory) are listed below for reference.     Procedures and Diagnostic Studies:   CT HEAD WO CONTRAST  Result Date: 11/30/2019 CLINICAL DATA:  Initial evaluation for acute seizure. EXAM: CT HEAD WITHOUT CONTRAST TECHNIQUE: Contiguous axial images were obtained from the base of the skull through the vertex without intravenous contrast. COMPARISON:  None. FINDINGS: Brain: Cerebral volume within normal limits for patient age. No evidence for acute intracranial hemorrhage. No findings to suggest acute large vessel territory infarct. No mass lesion, midline shift, or mass effect. Ventricles are normal in size without evidence  for hydrocephalus. No extra-axial fluid collection identified. Vascular: No hyperdense vessel identified. Skull: Scalp soft tissues demonstrate no acute abnormality. Calvarium intact. Sinuses/Orbits: Globes and orbital soft tissues within normal limits. Mild scattered mucosal thickening noted within the ethmoidal air cells and maxillary sinuses. Paranasal sinuses are otherwise clear. No mastoid effusion.  IMPRESSION: Negative head CT.  No acute intracranial abnormality identified. Electronically Signed   By: Jeannine Boga M.D.   On: 11/30/2019 00:38   DG Chest Portable 1 View  Result Date: 11/29/2019 CLINICAL DATA:  Seizure lasting 30-45 seconds. Patient complaining of chest pain yesterday. Possible alcohol involvement. EXAM: PORTABLE CHEST 1 VIEW COMPARISON:  None. FINDINGS: The heart size and mediastinal contours are within normal limits. The lungs are clear. No pneumothorax or large pleural effusion. The visualized skeletal structures are unremarkable. IMPRESSION: No acute cardiopulmonary finding. Electronically Signed   By: Audie Pinto M.D.   On: 11/29/2019 18:28     Labs:   Basic Metabolic Panel: Recent Labs  Lab 11/29/19 1833 11/30/19 0554 12/01/19 0413  NA 134* 136 132*  K 4.5 3.9 4.0  CL 94* 96* 98  CO2 27 30 23   GLUCOSE 178* 92 90  BUN 12 11 8   CREATININE 0.94 0.82 0.76  CALCIUM 9.5 9.7 9.2  MG  --  1.6*  --    GFR Estimated Creatinine Clearance: 117.1 mL/min (by C-G formula based on SCr of 0.76 mg/dL). Liver Function Tests: Recent Labs  Lab 11/29/19 1833 11/30/19 0554 12/01/19 0413  AST 186* 139* 111*  ALT 173* 144* 116*  ALKPHOS 54 59 54  BILITOT 2.2* 2.3* 1.9*  PROT 6.4* 6.1* 6.6  ALBUMIN 4.1 3.8 3.9   No results for input(s): LIPASE, AMYLASE in the last 168 hours. No results for input(s): AMMONIA in the last 168 hours. Coagulation profile No results for input(s): INR, PROTIME in the last 168 hours.  CBC: Recent Labs  Lab 11/29/19 1833 11/30/19 0616 12/01/19 0413  WBC 5.2 4.8 4.6  NEUTROABS 4.3 3.2  --   HGB 14.2 14.5 14.4  HCT 41.3 42.6 42.2  MCV 91.4 92.2 91.1  PLT 65* 69* 72*   Cardiac Enzymes: No results for input(s): CKTOTAL, CKMB, CKMBINDEX, TROPONINI in the last 168 hours. BNP: Invalid input(s): POCBNP CBG: No results for input(s): GLUCAP in the last 168 hours. D-Dimer No results for input(s): DDIMER in the last 72  hours. Hgb A1c Recent Labs    11/30/19 0616  HGBA1C 5.4   Lipid Profile No results for input(s): CHOL, HDL, LDLCALC, TRIG, CHOLHDL, LDLDIRECT in the last 72 hours. Thyroid function studies No results for input(s): TSH, T4TOTAL, T3FREE, THYROIDAB in the last 72 hours.  Invalid input(s): FREET3 Anemia work up No results for input(s): VITAMINB12, FOLATE, FERRITIN, TIBC, IRON, RETICCTPCT in the last 72 hours. Microbiology Recent Results (from the past 240 hour(s))  SARS CORONAVIRUS 2 (TAT 6-24 HRS) Nasopharyngeal Nasopharyngeal Swab     Status: None   Collection Time: 11/29/19 10:23 PM   Specimen: Nasopharyngeal Swab  Result Value Ref Range Status   SARS Coronavirus 2 NEGATIVE NEGATIVE Final    Comment: (NOTE) SARS-CoV-2 target nucleic acids are NOT DETECTED. The SARS-CoV-2 RNA is generally detectable in upper and lower respiratory specimens during the acute phase of infection. Negative results do not preclude SARS-CoV-2 infection, do not rule out co-infections with other pathogens, and should not be used as the sole basis for treatment or other patient management decisions. Negative results must be combined with clinical observations, patient  history, and epidemiological information. The expected result is Negative. Fact Sheet for Patients: HairSlick.no Fact Sheet for Healthcare Providers: quierodirigir.com This test is not yet approved or cleared by the [place] FDA and  has been authorized for detection and/or diagnosis of SARS-CoV-2 by FDA under an Emergency Use Authorization (EUA). This EUA will remain  in effect (meaning this test can be used) for the duration of the COVID-19 declaration under Section 56 4(b)(1) of the Act, 21 U.S.C. section 360bbb-3(b)(1), unless the authorization is terminated or revoked sooner. Performed at [facility], [address], [place], [place] [address]      Discharge  Instructions:   Discharge Instructions    Diet general   Complete by: As directed    Driving Restrictions   Complete by: As directed    No driving until released by PCP   Increase activity slowly   Complete by: As directed      Allergies as of 12/01/2019      Reactions   Smallpox Vaccine Other (See Comments)   unkown      Medication List    STOP taking these medications   diltiazem 120 MG 24 hr capsule Commonly known as: Cardizem CD   Eliquis DVT/PE Starter Pack 5 MG Tabs     TAKE these medications   chlordiazePOXIDE 25 MG capsule Commonly known as: LIBRIUM 50mg  PO TID x 1D, then 50mg  PO BID X 1D, the-50mg  PO QD X 1D What changed: additional instructions      Follow-up Information    [facility] Follow up in 1 week(s).   Specialty: General Practice Contact information: [address] [place] [phone]            Time coordinating discharge: 35 min  Signed:  [id number] DO  [facility] 12/01/2019, 9:43 AM

## 2019-12-01 NOTE — Discharge Instructions (Signed)
Alcohol Withdrawal Syndrome Alcohol withdrawal syndrome is a group of symptoms that can develop when a person who drinks heavily and regularly stops drinking or drinks less. Alcohol withdrawal syndrome can be mild or severe, and it may even be life-threatening. Alcohol withdrawal syndrome usually affects people who have alcohol use disorder, which may also be called alcoholism. Alcohol use disorder is when a person is unable to control his or her alcohol use, and drinking too much or too often causes problems at home, at work, or in relationships. What are the causes? Drinking heavily and drinking on a regular basis cause changes in brain chemistry. Over time, the body becomes dependent on alcohol. When alcohol use stops, the chemistry system in the brain becomes unbalanced and causes the symptoms of alcohol withdrawal. What increases the risk? Alcohol withdrawal syndrome is more likely to occur in people who drink more than the recommended limit of alcohol (2 drinks a day for men or 1 drink a day for non-pregnant women). It is also more likely to affect heavy drinkers who have been using alcohol for long periods of time. The more a person drinks and the longer he or she drinks, the greater the risk of alcohol withdrawal syndrome. Severe withdrawal is more likely to develop in someone who:  Had severe alcohol withdrawal in the past.  Had a seizure during a previous episode of alcohol withdrawal.  Is elderly.  Uses other drugs.  Has a long-term (chronic) medical problem, such as heart, lung, or liver disease.  Has depression.  Does not get enough nutrients from his or her diet (malnutrition). What are the signs or symptoms? Symptoms of this condition can be mild to moderate, or they can be severe. Symptoms may develop a few hours (or up to a day) after a person changes his or her drinking patterns. During the 48 hours after he or she has stopped drinking, the following symptoms may go away or  get better:  Uncontrollable shaking (tremor).  Sweating.  Headache.  Anxiety.  Inability to relax (agitation).  Trouble sleeping (insomnia).  Irregular heartbeats (palpitations).  Alcohol cravings.  Seizure. The following symptoms may get worse 24-48 hours after a person has decreased or stopped alcohol use, and they may gradually improve over a period of days or weeks:  Nausea and vomiting.  Fatigue.  Sensitivity to light and sounds.  Confusion and inability to think clearly.  Loss of appetite.  Mood swings, irritability, depression, and anxiety.  Insomnia and nightmares. The following symptoms are severe and life-threatening. When these symptoms occur together, they are called delirium tremens (DTs):  High blood pressure.  Increased heart rate.  Trouble breathing.  Seizures. These may go away along with other symptoms, or they may persist.  Seeing, hearing, feeling, smelling, or tasting things that are not there (hallucinations). If you experience hallucinations, they usually begin 12-24 hours after a change in drinking patterns. Delirium tremens requires immediate hospitalization. How is this diagnosed? This condition may be diagnosed based on:  Your symptoms and medical history.  Your history of alcohol use. Your health care provider may ask questions about your drinking behavior. It is important to be honest when you answer these questions.  A psychological assessment.  A physical exam.  Blood tests or urine tests to measure blood alcohol level and to rule out other causes of symptoms.  MRI or CT scan. This may be done if you seem to have abnormal thinking or behaviors (altered mental status). Diagnosis can be difficult.   People going through withdrawal often avoid seeking medical care and are not thinking clearly. Friends and family members play an important role in recognizing symptoms and encouraging loved ones to get treatment. How is this  treated? Most people with symptoms of withdrawal can be treated outside of a hospital setting (outpatient treatment), with close monitoring such as daily check-ins with a health care provider and counseling. You may need treatment at a hospital or treatment center (inpatient treatment) if:  You have a history of delirium tremens or seizures.  You have severe symptoms.  You are addicted to other drugs.  You cannot swallow medicine.  You have a serious medical condition such as heart failure.  You experienced withdrawal in the past but then you continued drinking alcohol.  You are not likely to commit to an outpatient treatment schedule. Treatment may involve:  Monitoring your blood pressure, pulse, and breathing.  IV fluids to keep you hydrated.  Medicines to reduce withdrawal symptoms and discomfort (benzodiazepines).  Medicine to reduce anxiety.  Medicine to prevent or control seizures.  Multivitamins and B vitamins.  Having a health care provider check on you daily. It is important to get treatment for alcohol withdrawal early. Getting treatment early can:  Speed up your recovery from withdrawal symptoms.  Make you more successful with long-term stoppage of alcohol use (sobriety). If you need help to stop drinking, your health care provider may recommend a long-term treatment plan that includes:  Medicines to help treat alcohol use disorder.  Substance abuse counseling.  Support groups. Follow these instructions at home:   Take over-the-counter and prescription medicines (including vitamin supplements) only as told by your health care provider.  Do not drink alcohol.  Do not drive until your health care provider approves.  Have someone you trust stay with you or be available if you need help with your symptoms or with not drinking.  Drink enough fluid to keep your urine pale yellow.  Consider joining an alcohol support group or treatment program. These can  provide emotional support, advice, and guidance.  Keep all follow-up visits as told by your health care provider. This is important. Contact a health care provider if:  Your symptoms get worse instead of better.  You cannot eat or drink without vomiting.  You are struggling with not drinking alcohol.  You cannot stop drinking alcohol. Get help right away if:  You have an irregular heartbeat.  You have chest pain.  You have trouble breathing.  You have a seizure for the first time.  You hallucinate.  You become very confused. Summary  Alcohol withdrawal is a group of symptoms that can develop when a person who drinks heavily and regularly stops drinking or drinks less.  Symptoms of this condition can be mild to moderate, or they can be severe.  Treatment may include hospitalization, medicine, and counseling. This information is not intended to replace advice given to you by your health care provider. Make sure you discuss any questions you have with your health care provider. Document Revised: 10/28/2017 Document Reviewed: 07/22/2017 Elsevier Patient Education  2020 Elsevier Inc.   Alcohol Abuse and Dependence Information, Adult Alcohol is a widely available drug. People drink alcohol in different amounts. People who drink alcohol very often and in large amounts often have problems during and after drinking. They may develop what is called an alcohol use disorder. There are two main types of alcohol use disorders:  Alcohol abuse. This is when you use alcohol too much   or too often. You may use alcohol to make yourself feel happy or to reduce stress. You may have a hard time setting a limit on the amount you drink.  Alcohol dependence. This is when you use alcohol consistently for a period of time, and your body changes as a result. This can make it hard to stop drinking because you may start to feel sick or feel different when you do not use alcohol. These symptoms are known  as withdrawal. How can alcohol abuse and dependence affect me? Alcohol abuse and dependence can have a negative effect on your life. Drinking too much can lead to addiction. You may feel like you need alcohol to function normally. You may drink alcohol before work in the morning, during the day, or as soon as you get home from work in the evening. These actions can result in:  Poor work performance.  Job loss.  Financial problems.  Car crashes or criminal charges from driving after drinking alcohol.  Problems in your relationships with friends and family.  Losing the trust and respect of coworkers, friends, and family. Drinking heavily over a long period of time can permanently damage your body and brain, and can cause lifelong health issues, such as:  Damage to your liver or pancreas.  Heart problems, high blood pressure, or stroke.  Certain cancers.  Decreased ability to fight infections.  Brain or nerve damage.  Depression.  Early (premature) death. If you are careless or you crave alcohol, it is easy to drink more than your body can handle (overdose). Alcohol overdose is a serious situation that requires hospitalization. It may lead to permanent injuries or death. What can increase my risk?  Having a family history of alcohol abuse.  Having depression or other mental health conditions.  Beginning to drink at an early age.  Binge drinking often.  Experiencing trauma, stress, and an unstable home life during childhood.  Spending time with people who drink often. What actions can I take to prevent or manage alcohol abuse and dependence?  Do not drink alcohol if: ? Your health care provider tells you not to drink. ? You are pregnant, may be pregnant, or are planning to become pregnant.  If you drink alcohol: ? Limit how much you use to:  0-1 drink a day for women.  0-2 drinks a day for men. ? Be aware of how much alcohol is in your drink. In the [place] one drink  equals one 12 oz bottle of beer (355 mL), one 5 oz glass of wine (148 mL), or one 1 oz glass of hard liquor (44 mL).  Stop drinking if you have been drinking too much. This can be very hard to do if you are used to abusing alcohol. If you begin to have withdrawal symptoms, talk with your health care provider or a person that you trust. These symptoms may include anxiety, shaky hands, headache, nausea, sweating, or not being able to sleep.  Choose to drink nonalcoholic beverages in social gatherings and places where there may be alcohol. Activity  Spend more time on activities that you enjoy that do not involve alcohol, like hobbies or exercise.  Find healthy ways to cope with stress, such as exercise, meditation, or spending time with people you care about. General information  Talk to your family, coworkers, and friends about supporting you in your efforts to stop drinking. If they drink, ask them not to drink around you. Spend more time with people who do not   drink alcohol.  If you think that you have an alcohol dependency problem: ? Tell friends or family about your concerns. ? Talk with your health care provider or another health professional about where to get help. ? Work with a therapist and a chemical dependency counselor. ? Consider joining a support group for people who struggle with alcohol abuse and dependence. Where to find support   Your health care provider.  SMART Recovery: www.smartrecovery.org Therapy and support groups  Local treatment centers or chemical dependency counselors.  Local AA groups in your community: www.aa.org Where to find more information  Centers for Disease Control and Prevention: www.cdc.gov  National Institute on Alcohol Abuse and Alcoholism: www.niaaa.nih.gov  Alcoholics Anonymous (AA): www.aa.org Contact a health care provider if:  You drank more or for longer than you intended on more than one occasion.  You tried to stop drinking or  to cut back on how much you drink, but you were not able to.  You often drink to the point of vomiting or passing out.  You want to drink so badly that you cannot think about anything else.  You have problems in your life due to drinking, but you continue to drink.  You keep drinking even though you feel anxious, depressed, or have experienced memory loss.  You have stopped doing the things you used to enjoy in order to drink.  You have to drink more than you used to in order to get the effect you want.  You experience anxiety, sweating, nausea, shakiness, and trouble sleeping when you try to stop drinking. Get help right away if:  You have thoughts about hurting yourself or others.  You have serious withdrawal symptoms, including: ? Confusion. ? Racing heart. ? High blood pressure. ? Fever. If you ever feel like you may hurt yourself or others, or have thoughts about taking your own life, get help right away. You can go to your nearest emergency department or call:  Your local emergency services (911 in the [place]  A suicide crisis helpline, such as the National Suicide Prevention Lifeline at 1-800-273-8255. This is open 24 hours a day. Summary  Alcohol abuse and dependence can have a negative effect on your life. Drinking too much or too often can lead to addiction.  If you drink alcohol, limit how much you use.  If you are having trouble keeping your drinking under control, find ways to change your behavior. Hobbies, calming activities, exercise, or support groups can help.  If you feel you need help with changing your drinking habits, talk with your health care provider, a good friend, or a therapist, or go to an AA group. This information is not intended to replace advice given to you by your health care provider. Make sure you discuss any questions you have with your health care provider. Document Revised: 03/06/2019 Document Reviewed: 01/23/2019 Elsevier Patient  Education  2020 Elsevier Inc.  

## 2019-12-01 NOTE — Progress Notes (Addendum)
RN at pts. Bedside to restart IV fluids. Pt. Requesting IV fluids be stopped. Pt. States he's had enough fluids and should be hydrated by now.

## 2019-12-01 NOTE — Care Management (Signed)
Went to room to provide SA/ ETOH resources, patient has already left.

## 2019-12-01 NOTE — Progress Notes (Signed)
PT. Removed telemetry box. States he's going home today and doesn't need it anymore. RN explained to pt. That telemetry monitor will need to stay on until discharge or an order to discontinue monitoring.

## 2020-04-19 ENCOUNTER — Other Ambulatory Visit: Payer: Self-pay

## 2020-04-19 ENCOUNTER — Encounter (HOSPITAL_COMMUNITY): Payer: Self-pay

## 2020-04-19 ENCOUNTER — Emergency Department (HOSPITAL_COMMUNITY): Payer: No Typology Code available for payment source

## 2020-04-19 ENCOUNTER — Inpatient Hospital Stay (HOSPITAL_COMMUNITY)
Admission: EM | Admit: 2020-04-19 | Discharge: 2020-04-21 | DRG: 101 | Disposition: A | Discharge status: Home / Self Care | Payer: No Typology Code available for payment source | Attending: Internal Medicine | Admitting: Internal Medicine

## 2020-04-19 DIAGNOSIS — F4310 Post-traumatic stress disorder, unspecified: Secondary | ICD-10-CM

## 2020-04-19 DIAGNOSIS — E876 Hypokalemia: Secondary | ICD-10-CM

## 2020-04-19 DIAGNOSIS — Z20822 Contact with and (suspected) exposure to covid-19: Secondary | ICD-10-CM | POA: Diagnosis present

## 2020-04-19 DIAGNOSIS — Y900 Blood alcohol level of less than 20 mg/100 ml: Secondary | ICD-10-CM | POA: Diagnosis present

## 2020-04-19 DIAGNOSIS — R748 Abnormal levels of other serum enzymes: Secondary | ICD-10-CM | POA: Diagnosis present

## 2020-04-19 DIAGNOSIS — F10939 Alcohol use, unspecified with withdrawal, unspecified: Secondary | ICD-10-CM | POA: Diagnosis present

## 2020-04-19 DIAGNOSIS — D696 Thrombocytopenia, unspecified: Secondary | ICD-10-CM | POA: Diagnosis present

## 2020-04-19 DIAGNOSIS — M6282 Rhabdomyolysis: Secondary | ICD-10-CM | POA: Diagnosis present

## 2020-04-19 DIAGNOSIS — E869 Volume depletion, unspecified: Secondary | ICD-10-CM | POA: Diagnosis present

## 2020-04-19 DIAGNOSIS — F10930 Alcohol use, unspecified with withdrawal, uncomplicated: Secondary | ICD-10-CM

## 2020-04-19 DIAGNOSIS — R569 Unspecified convulsions: Secondary | ICD-10-CM | POA: Diagnosis not present

## 2020-04-19 DIAGNOSIS — G40909 Epilepsy, unspecified, not intractable, without status epilepticus: Secondary | ICD-10-CM | POA: Diagnosis not present

## 2020-04-19 DIAGNOSIS — E871 Hypo-osmolality and hyponatremia: Secondary | ICD-10-CM

## 2020-04-19 DIAGNOSIS — Z79899 Other long term (current) drug therapy: Secondary | ICD-10-CM

## 2020-04-19 DIAGNOSIS — N179 Acute kidney failure, unspecified: Secondary | ICD-10-CM | POA: Diagnosis not present

## 2020-04-19 DIAGNOSIS — F10230 Alcohol dependence with withdrawal, uncomplicated: Secondary | ICD-10-CM | POA: Diagnosis not present

## 2020-04-19 HISTORY — DX: Post-traumatic stress disorder, unspecified: F43.10

## 2020-04-19 HISTORY — DX: Unspecified convulsions: R56.9

## 2020-04-19 LAB — RAPID URINE DRUG SCREEN, HOSP PERFORMED
Amphetamines: NOT DETECTED
Barbiturates: NOT DETECTED
Benzodiazepines: NOT DETECTED
Cocaine: NOT DETECTED
Opiates: NOT DETECTED
Tetrahydrocannabinol: NOT DETECTED

## 2020-04-19 LAB — COMPREHENSIVE METABOLIC PANEL
ALT: 86 U/L — ABNORMAL HIGH (ref 0–44)
AST: 152 U/L — ABNORMAL HIGH (ref 15–41)
Albumin: 5.1 g/dL — ABNORMAL HIGH (ref 3.5–5.0)
Alkaline Phosphatase: 67 U/L (ref 38–126)
Anion gap: 21 — ABNORMAL HIGH (ref 5–15)
BUN: 14 mg/dL (ref 6–20)
CO2: 26 mmol/L (ref 22–32)
Calcium: 10.2 mg/dL (ref 8.9–10.3)
Chloride: 86 mmol/L — ABNORMAL LOW (ref 98–111)
Creatinine, Ser: 1.09 mg/dL (ref 0.61–1.24)
GFR calc Af Amer: >60 mL/min (ref >60)
GFR calc non Af Amer: >60 mL/min (ref >60)
Glucose, Bld: 75 mg/dL (ref 70–99)
Potassium: 3.1 mmol/L — ABNORMAL LOW (ref 3.5–5.1)
Sodium: 133 mmol/L — ABNORMAL LOW (ref 135–145)
Total Bilirubin: 3.4 mg/dL — ABNORMAL HIGH (ref 0.3–1.2)
Total Protein: 8 g/dL (ref 6.5–8.1)

## 2020-04-19 LAB — URINALYSIS, ROUTINE W REFLEX MICROSCOPIC
Bacteria, UA: NONE SEEN
Bilirubin Urine: NEGATIVE
Glucose, UA: NEGATIVE mg/dL
Ketones, ur: 5 mg/dL — AB
Leukocytes,Ua: NEGATIVE
Nitrite: NEGATIVE
Protein, ur: 100 mg/dL — AB
Specific Gravity, Urine: 1.013 (ref 1.005–1.030)
pH: 6 (ref 5.0–8.0)

## 2020-04-19 LAB — CBC WITH DIFFERENTIAL/PLATELET
Abs Immature Granulocytes: 0.02 10*3/uL (ref 0.00–0.07)
Basophils Absolute: 0 10*3/uL (ref 0.0–0.1)
Basophils Relative: 1 %
Eosinophils Absolute: 0 10*3/uL (ref 0.0–0.5)
Eosinophils Relative: 0 %
HCT: 43.8 % (ref 39.0–52.0)
Hemoglobin: 15.3 g/dL (ref 13.0–17.0)
Immature Granulocytes: 0 %
Lymphocytes Relative: 11 %
Lymphs Abs: 0.7 10*3/uL (ref 0.7–4.0)
MCH: 33.6 pg (ref 26.0–34.0)
MCHC: 34.9 g/dL (ref 30.0–36.0)
MCV: 96.3 fL (ref 80.0–100.0)
Monocytes Absolute: 0.5 10*3/uL (ref 0.1–1.0)
Monocytes Relative: 8 %
Neutro Abs: 5.1 10*3/uL (ref 1.7–7.7)
Neutrophils Relative %: 80 %
Platelets: 178 10*3/uL (ref 150–400)
RBC: 4.55 MIL/uL (ref 4.22–5.81)
RDW: 11.8 % (ref 11.5–15.5)
WBC: 6.4 10*3/uL (ref 4.0–10.5)
nRBC: 0 % (ref 0.0–0.2)

## 2020-04-19 LAB — ETHANOL: Alcohol, Ethyl (B): <10 mg/dL (ref <10)

## 2020-04-19 LAB — LIPASE, BLOOD: Lipase: 81 U/L — ABNORMAL HIGH (ref 11–51)

## 2020-04-19 MED ORDER — ENOXAPARIN SODIUM 40 MG/0.4ML ~~LOC~~ SOLN
40.0000 mg | Freq: Every day | SUBCUTANEOUS | Status: DC
  Administered 2020-04-20 (×2): 40 mg via SUBCUTANEOUS
  Filled 2020-04-19 (×2): qty 0.4

## 2020-04-19 MED ORDER — LORAZEPAM 1 MG PO TABS
0.0000 mg | ORAL_TABLET | Freq: Four times a day (QID) | ORAL | Status: DC
  Administered 2020-04-20: 1 mg via ORAL
  Filled 2020-04-19: qty 1

## 2020-04-19 MED ORDER — FOLIC ACID 1 MG PO TABS
1.0000 mg | ORAL_TABLET | Freq: Every day | ORAL | Status: DC
  Administered 2020-04-20 – 2020-04-21 (×2): 1 mg via ORAL
  Filled 2020-04-19 (×2): qty 1

## 2020-04-19 MED ORDER — SODIUM CHLORIDE 0.9 % IV BOLUS
1000.0000 mL | Freq: Once | INTRAVENOUS | Status: AC
  Administered 2020-04-19: 1000 mL via INTRAVENOUS

## 2020-04-19 MED ORDER — LORAZEPAM 2 MG/ML IJ SOLN
0.0000 mg | Freq: Four times a day (QID) | INTRAMUSCULAR | Status: DC
  Administered 2020-04-19: 1 mg via INTRAVENOUS
  Filled 2020-04-19: qty 1

## 2020-04-19 MED ORDER — SODIUM CHLORIDE 0.9 % IV SOLN: INTRAVENOUS | Status: DC

## 2020-04-19 MED ORDER — ACETAMINOPHEN 325 MG PO TABS
650.0000 mg | ORAL_TABLET | ORAL | Status: DC | PRN
  Administered 2020-04-20: 650 mg via ORAL
  Filled 2020-04-19: qty 2

## 2020-04-19 MED ORDER — LORAZEPAM 1 MG PO TABS: 0.0000 mg | ORAL_TABLET | Freq: Two times a day (BID) | ORAL | Status: DC

## 2020-04-19 MED ORDER — LORAZEPAM 2 MG/ML IJ SOLN: 0.0000 mg | Freq: Two times a day (BID) | INTRAMUSCULAR | Status: DC

## 2020-04-19 MED ORDER — POLYETHYLENE GLYCOL 3350 17 G PO PACK: 17.0000 g | PACK | Freq: Every day | ORAL | Status: DC | PRN

## 2020-04-19 MED ORDER — ONDANSETRON HCL 4 MG PO TABS: 4.0000 mg | ORAL_TABLET | Freq: Four times a day (QID) | ORAL | Status: DC | PRN

## 2020-04-19 MED ORDER — POTASSIUM CHLORIDE 20 MEQ PO PACK
40.0000 meq | PACK | Freq: Two times a day (BID) | ORAL | Status: AC
  Administered 2020-04-20 (×2): 40 meq via ORAL
  Filled 2020-04-19 (×2): qty 2

## 2020-04-19 MED ORDER — LORAZEPAM 1 MG PO TABS: 0.0000 mg | ORAL_TABLET | Freq: Four times a day (QID) | ORAL | Status: DC

## 2020-04-19 MED ORDER — LORAZEPAM 2 MG/ML IJ SOLN: 1.0000 mg | INTRAMUSCULAR | Status: DC | PRN

## 2020-04-19 MED ORDER — LORAZEPAM 1 MG PO TABS: 1.0000 mg | ORAL_TABLET | ORAL | Status: DC | PRN

## 2020-04-19 MED ORDER — ONDANSETRON HCL 4 MG/2ML IJ SOLN: 4.0000 mg | Freq: Four times a day (QID) | INTRAMUSCULAR | Status: DC | PRN

## 2020-04-19 MED ORDER — THIAMINE HCL 100 MG/ML IJ SOLN: 100.0000 mg | Freq: Every day | INTRAMUSCULAR | Status: DC

## 2020-04-19 MED ORDER — POTASSIUM CHLORIDE 20 MEQ PO PACK: 40.0000 meq | PACK | Freq: Two times a day (BID) | ORAL | Status: DC

## 2020-04-19 MED ORDER — THIAMINE HCL 100 MG PO TABS
100.0000 mg | ORAL_TABLET | Freq: Every day | ORAL | Status: DC
  Administered 2020-04-20 – 2020-04-21 (×2): 100 mg via ORAL
  Filled 2020-04-19 (×2): qty 1

## 2020-04-19 MED ORDER — POTASSIUM CHLORIDE 20 MEQ PO PACK
40.0000 meq | PACK | Freq: Once | ORAL | Status: AC
  Administered 2020-04-19: 40 meq via ORAL
  Filled 2020-04-19: qty 2

## 2020-04-19 MED ORDER — ADULT MULTIVITAMIN W/MINERALS CH
1.0000 | ORAL_TABLET | Freq: Every day | ORAL | Status: DC
  Administered 2020-04-20 – 2020-04-21 (×2): 1 via ORAL
  Filled 2020-04-19 (×2): qty 1

## 2020-04-19 MED ORDER — THIAMINE HCL 100 MG/ML IJ SOLN
100.0000 mg | Freq: Every day | INTRAMUSCULAR | Status: DC
  Administered 2020-04-19: 100 mg via INTRAVENOUS
  Filled 2020-04-19: qty 2

## 2020-04-19 MED ORDER — THIAMINE HCL 100 MG PO TABS: 100.0000 mg | ORAL_TABLET | Freq: Every day | ORAL | Status: DC

## 2020-04-19 MED ORDER — ACETAMINOPHEN 650 MG RE SUPP: 650.0000 mg | RECTAL | Status: DC | PRN

## 2020-04-19 NOTE — ED Notes (Signed)
Pts sister, Thayer Ohm, states she will pick up pt when/if discharged. [phone]

## 2020-04-19 NOTE — H&P (Signed)
History and Physical    Sean Fields [id number]:[id number] DOB: Aug 29, 1981 DOA: 04/19/2020  Referring MD/NP/PA: Iran Planas, PA PCP: [facility]   Patient coming from: Home  Chief Complaint: Seizure  HPI: Sean Fields is a 39 y.o. male with medical history significant of alcohol abuse, PTS, and previous seizures from alcohol withdrawal who presents to the hospital due to seizure activity. He was sitting on the couch when his sister reported that he had seizure like activity for ~12minutes while on the couch. She called EMS who brought the patient to the hospital. Patient does not remember the event at all, however he does have a history of seizures associated with alcohol withdrawal. On EMS arrival he was postictal and reportedly aggressive although he is now alert and oriented in no apparent distress. He does report alcohol use however denies drinking daily although admits to 6-8 beers when he does drink. His last drink was ~2 days ago. He has no complaints and is asking for something to eat. He denies any CP, SOB, N/V/D, fever, chills, dysuria, cough.   ED Course: In the ED patient remained seizure free with improvement in his mental status. He underwent CT head which revealed no acute abnormalities. Labs were significant for hypokalemia, hyponatremia, elevated creatinine from his baseline to 1.09, and negative EtOH. He was given 1 mg of ativan along with PO potassium, IVF in form of NS 1 L bolus, and 100 mg IV thiamine.   He will be admitted to hospitalist service with observation status for seizure secondary to alcohol withdrawal.   Review of Systems: As per HPI otherwise 10 point review of systems negative.   Past Medical History:  Diagnosis Date  . PTSD (post-traumatic stress disorder) 04/19/2020  . Seizure (HCC)   . Seizures (HCC)    due to alcohol withdrawal    Past Surgical History:  Procedure Laterality Date  . HERNIA REPAIR       reports that he has been smoking. He has never  used smokeless tobacco. He reports current alcohol use. He reports that he does not use drugs.  Allergies  Allergen Reactions  . Smallpox Vaccine Other (See Comments)    unkown    Family History  Family history unknown: Yes    Prior to Admission medications   Medication Sig Start Date End Date Taking? Authorizing Provider  chlordiazePOXIDE (LIBRIUM) 25 MG capsule 50mg  PO TID x 1D, then 50mg  PO BID X 1D, the-50mg  PO QD X 1D Patient not taking: Reported on 04/19/2020 12/01/19   04/21/2020, DO    Physical Exam: Vitals:   04/19/20 1930 04/19/20 2000 04/19/20 2100 04/19/20 2130  BP: 133/86 (!) 128/91 132/90 (!) 124/94  Pulse: (!) 102 (!) 111 94 91  Resp: 18 (!) 22 11 17   Temp:      TempSrc:      SpO2: 96% 93% 93% 95%      Constitutional: NAD, calm, comfortable Vitals:   04/19/20 1930 04/19/20 2000 04/19/20 2100 04/19/20 2130  BP: 133/86 (!) 128/91 132/90 (!) 124/94  Pulse: (!) 102 (!) 111 94 91  Resp: 18 (!) 22 11 17   Temp:      TempSrc:      SpO2: 96% 93% 93% 95%   Eyes: PERRL, lids and conjunctivae normal ENMT: Mucous membranes are moist. Posterior pharynx clear of any exudate or lesions.  Neck: normal, supple, no masses Respiratory: clear to auscultation bilaterally, no wheezing, no crackles. Normal respiratory effort. No accessory muscle use.  Cardiovascular: Regular rate and rhythm, no murmurs / rubs / gallops. No extremity edema. 2+ pedal pulses. Abdomen: no tenderness, no masses palpated. No hepatosplenomegaly. Bowel sounds positive.  Musculoskeletal: no clubbing / cyanosis. No joint deformity upper and lower extremities. Good ROM, no contractures. Normal muscle tone.  Skin: no rashes, lesions, ulcers. No induration Neurologic: CN 2-12 grossly intact. Sensation intact. Strength 5/5 in all 4.  Psychiatric: Normal judgment and insight. Alert and oriented x 3. Normal mood.    Labs on Admission: I have personally reviewed following labs and imaging  studies  CBC: Recent Labs  Lab 04/19/20 1907  WBC 6.4  NEUTROABS 5.1  HGB 15.3  HCT 43.8  MCV 96.3  PLT 272   Basic Metabolic Panel: Recent Labs  Lab 04/19/20 1907  NA 133*  K 3.1*  CL 86*  CO2 26  GLUCOSE 75  BUN 14  CREATININE 1.09  CALCIUM 10.2   GFR: CrCl cannot be calculated (Unknown ideal weight.). Liver Function Tests: Recent Labs  Lab 04/19/20 1907  AST 152*  ALT 86*  ALKPHOS 67  BILITOT 3.4*  PROT 8.0  ALBUMIN 5.1*   Recent Labs  Lab 04/19/20 1915  LIPASE 81*   No results for input(s): AMMONIA in the last 168 hours. Coagulation Profile: No results for input(s): INR, PROTIME in the last 168 hours. Cardiac Enzymes: No results for input(s): CKTOTAL, CKMB, CKMBINDEX, TROPONINI in the last 168 hours. BNP (last 3 results) No results for input(s): PROBNP in the last 8760 hours. HbA1C: No results for input(s): HGBA1C in the last 72 hours. CBG: No results for input(s): GLUCAP in the last 168 hours. Lipid Profile: No results for input(s): CHOL, HDL, LDLCALC, TRIG, CHOLHDL, LDLDIRECT in the last 72 hours. Thyroid Function Tests: No results for input(s): TSH, T4TOTAL, FREET4, T3FREE, THYROIDAB in the last 72 hours. Anemia Panel: No results for input(s): VITAMINB12, FOLATE, FERRITIN, TIBC, IRON, RETICCTPCT in the last 72 hours. Urine analysis:    Component Value Date/Time   COLORURINE YELLOW 04/19/2020 1938   APPEARANCEUR CLEAR 04/19/2020 1938   LABSPEC 1.013 04/19/2020 1938   PHURINE 6.0 04/19/2020 1938   GLUCOSEU NEGATIVE 04/19/2020 1938   HGBUR MODERATE (A) 04/19/2020 1938   BILIRUBINUR NEGATIVE 04/19/2020 1938   KETONESUR 5 (A) 04/19/2020 1938   PROTEINUR 100 (A) 04/19/2020 1938   NITRITE NEGATIVE 04/19/2020 1938   LEUKOCYTESUR NEGATIVE 04/19/2020 1938   Sepsis Labs: @LABRCNTIP (procalcitonin:4,lacticidven:4) )No results found for this or any previous visit (from the past 240 hour(s)).   Radiological Exams on Admission: CT Head Wo  Contrast  Result Date: 04/19/2020 CLINICAL DATA:  Status post seizure. EXAM: CT HEAD WITHOUT CONTRAST TECHNIQUE: Contiguous axial images were obtained from the base of the skull through the vertex without intravenous contrast. COMPARISON:  November 30, 2019 FINDINGS: Brain: No evidence of acute infarction, hemorrhage, hydrocephalus, extra-axial collection or mass lesion/mass effect. Vascular: No hyperdense vessel or unexpected calcification. Skull: Normal. Negative for fracture or focal lesion. Sinuses/Orbits: No acute finding. Other: None. IMPRESSION: No acute intracranial pathology. Electronically Signed   By: Virgina Norfolk M.D.   On: 04/19/2020 20:41    Assessment/Plan Active Problems:   Alcohol withdrawal (HCC)   Seizure (HCC)   Hyponatremia   Hypokalemia   AKI (acute kidney injury) ([place])   PTSD (post-traumatic stress disorder)  #Seizure - Likely recurrent secondary to alcohol withdrawal based on history  - UA negative, no s/s of infectious process - No further seizure like activity in ED - Will place on  seizure precautions as well as alcohol withdrawal protocol - Monitor on telemetry - Q4H neurochecks - Will neurology evaluation/follow up given recurrent seizures although all have seemed provoked via EtOH withdrawal - Will check osmolality given elevated anion gap although likely related to prior seizure - Checking magnesium and will replete as needed  #EtOH withdrawal - Treating as above with alcohol withdrawal protocol and seizure precautions - Q4H neurochecks - Recommend close outpatient follow up with psychiatry as well given his history of PTSD - Checking mag and phos - Providing thiamine and folate replacement while in the hospital   #AKI - Likely secondary to volume depletion - Received 1 L of IVF in ED and will continue maintenance fluid and recheck labs in AM - Will check CK given hgb in urine with no noted blood in setting of seizure  #Hyponatremia - Likely  complicated by volume depletion and history of EtOH abuse - Repeat in AM. If not improving with IVF will obtain urine electrolytes  #PTSD - Needs outpatient psychiatry follow up  #Hypokalemia - 3.1. Secondary to poor oral intake/EtOH use - Checking mag and will replete to goal of 2 - Received 40 meq in ED and will given another 40 meq in AM and afternoon - Repeat CMP in AM   DVT prophylaxis: Lovenox Code Status: Full Family Communication: None. Spoke to patient at bedside. Sister had just left the ED. Disposition Plan: Anticipate discharge home to self care in next 24-48 hours pending clinical improvement Consults called: None Admission status: Obs   Pollyann Savoy DO [facility]  If 7PM-7AM, please contact night-coverage   04/19/2020, 11:14 PM

## 2020-04-19 NOTE — ED Triage Notes (Addendum)
Pt BIB EMS from home. Pt sister stated pt had a 2 min seizure on the couch, called EMS. Postictal pt was aggressive per EMS. EMS reports no seizure activity during transport. Pt A&Ox4 upon arrival. Pt has hx of PTSD, TBI, seizures. Pt sister stated pt is intoxicated, pt denies drinking alcohol today.   CBG 121 BP 132/78

## 2020-04-19 NOTE — ED Provider Notes (Addendum)
[facility] DEPT Provider Note   CSN: [id number] Arrival date & time: 04/19/20  2376     History Chief Complaint  Patient presents with  . Seizures    Sean Fields is a 39 y.o. male.  39 y.o male with a PMH of alcohol withdrawal with induced seizure presents to ED via EMS with chief complaint of "seizure-like activity" per sister.  She does not recall the onset and reports he was on the couch, and is unsure what occurred, states that he did wake up to EMS at his home.  Patient does endorse alcohol abuse, states he drinks about a 12 pack every other day, his last drink was about 2 days ago.  According to his file which I reviewed, he has been previously admitted for seizure induced from alcohol withdrawal.  He endorses pain throughout his whole body, denies any trauma.  No chest pain, shortness of breath, other complaints.  The history is provided by the patient.  Seizures      Past Medical History:  Diagnosis Date  . PTSD (post-traumatic stress disorder) 04/19/2020  . Seizure ([place])   . Seizures ([place])    due to alcohol withdrawal    Patient Active Problem List   Diagnosis Date Noted  . Hyponatremia 04/19/2020  . Hypokalemia 04/19/2020  . AKI (acute kidney injury) ([place]) 04/19/2020  . PTSD (post-traumatic stress disorder) 04/19/2020  . Elevated LFTs 11/30/2019  . Seizure ([place]) 11/30/2019  . Thrombocytopenia ([place]) 11/30/2019  . Alcohol withdrawal ([place]) 11/29/2019  . UNSPECIFIED DISEASE OF PHARYNX 10/15/2010    Past Surgical History:  Procedure Laterality Date  . HERNIA REPAIR         Family History  Family history unknown: Yes    Social History   Tobacco Use  . Smoking status: Current Some Day Smoker  . Smokeless tobacco: Never Used  Substance Use Topics  . Alcohol use: Yes    Comment: Weekends (girlfriend states every day)  . Drug use: No    Home Medications Prior to Admission medications   Medication Sig Start Date End  Date Taking? Authorizing Provider  chlordiazePOXIDE (LIBRIUM) 25 MG capsule 50mg  PO TID x 1D, then 50mg  PO BID X 1D, the-50mg  PO QD X 1D Patient not taking: Reported on 04/19/2020 12/01/19   Geradine Girt, DO    Allergies    Smallpox vaccine  Review of Systems   Review of Systems  Constitutional: Negative for fever.  HENT: Negative for sore throat.   Respiratory: Negative for shortness of breath.   Cardiovascular: Negative for chest pain.  Gastrointestinal: Negative for abdominal pain, nausea and vomiting.  Musculoskeletal: Negative for back pain.  Neurological: Positive for seizures.  Psychiatric/Behavioral: Positive for confusion. The patient is nervous/anxious.   All other systems reviewed and are negative.   Physical Exam Updated Vital Signs BP (!) 134/91 (BP Location: Right Arm)   Pulse 70   Temp 98.2 F (36.8 C) (Oral)   Resp 20   SpO2 97%   Physical Exam Vitals and nursing note reviewed.  Constitutional:      Comments: Appears disheveled.  HENT:     Head: Normocephalic and atraumatic.     Mouth/Throat:     Mouth: Mucous membranes are dry.  Eyes:     Pupils: Pupils are equal, round, and reactive to light.     Comments: Pupils are equal and reactive.  No scleral icterus.  Cardiovascular:     Rate and Rhythm: Tachycardia present.  Pulmonary:     Effort: Pulmonary effort is normal.     Breath sounds: No wheezing.  Abdominal:     General: Abdomen is flat.     Tenderness: There is no abdominal tenderness.     Comments: Abdomen is soft, nontender to palpation.  Musculoskeletal:     Cervical back: Normal range of motion and neck supple.  Skin:    General: Skin is warm and dry.  Neurological:     Mental Status: He is alert and oriented to person, place, and time.     Comments: Alert, oriented, thought content appropriate. Speech fluent without evidence of aphasia. Able to follow 2 step commands without difficulty.  Cranial Nerves:  II:  Peripheral visual fields  grossly normal, pupils, round, reactive to light III,IV, VI: ptosis not present, extra-ocular motions intact bilaterally  V,VII: smile symmetric, facial light touch sensation equal VIII: hearing grossly normal bilaterally  IX,X: midline uvula rise  XI: bilateral shoulder shrug equal and strong XII: midline tongue extension  Motor:  5/5 in upper and lower extremities bilaterally including strong and equal grip strength and dorsiflexion/plantar flexion Sensory: light touch normal in all extremities.  Cerebellar: delayed finger-to-nose with bilateral upper extremities, pronator drift negative       ED Results / Procedures / Treatments   Labs (all labs ordered are listed, but only abnormal results are displayed) Labs Reviewed  COMPREHENSIVE METABOLIC PANEL - Abnormal; Notable for the following components:      Result Value   Sodium 133 (*)    Potassium 3.1 (*)    Chloride 86 (*)    Albumin 5.1 (*)    AST 152 (*)    ALT 86 (*)    Total Bilirubin 3.4 (*)    Anion gap 21 (*)    All other components within normal limits  LIPASE, BLOOD - Abnormal; Notable for the following components:   Lipase 81 (*)    All other components within normal limits  URINALYSIS, ROUTINE W REFLEX MICROSCOPIC - Abnormal; Notable for the following components:   Hgb urine dipstick MODERATE (*)    Ketones, ur 5 (*)    Protein, ur 100 (*)    All other components within normal limits  MAGNESIUM - Abnormal; Notable for the following components:   Magnesium 1.4 (*)    All other components within normal limits  CBC - Abnormal; Notable for the following components:   Platelets 148 (*)    All other components within normal limits  COMPREHENSIVE METABOLIC PANEL - Abnormal; Notable for the following components:   Chloride 97 (*)    AST 101 (*)    ALT 61 (*)    Total Bilirubin 2.2 (*)    All other components within normal limits  CK - Abnormal; Notable for the following components:   Total CK 1,160 (*)    All  other components within normal limits  SARS CORONAVIRUS 2 BY RT PCR (HOSPITAL ORDER, PERFORMED IN [facility])  CBC WITH DIFFERENTIAL/PLATELET  ETHANOL  RAPID URINE DRUG SCREEN, HOSP PERFORMED  PHOSPHORUS  PROTIME-INR  APTT  OSMOLALITY  MAGNESIUM  PHOSPHORUS    EKG None  Radiology CT Head Wo Contrast  Result Date: 04/19/2020 CLINICAL DATA:  Status post seizure. EXAM: CT HEAD WITHOUT CONTRAST TECHNIQUE: Contiguous axial images were obtained from the base of the skull through the vertex without intravenous contrast. COMPARISON:  November 30, 2019 FINDINGS: Brain: No evidence of acute infarction, hemorrhage, hydrocephalus, extra-axial collection or mass lesion/mass  effect. Vascular: No hyperdense vessel or unexpected calcification. Skull: Normal. Negative for fracture or focal lesion. Sinuses/Orbits: No acute finding. Other: None. IMPRESSION: No acute intracranial pathology. Electronically Signed   By: Aram Candela M.D.   On: 04/19/2020 20:41    Procedures .Critical Care Performed by: Claude Manges, PA-C Authorized by: Claude Manges, PA-C   Critical care provider statement:    Critical care time (minutes):  35   Critical care start time:  04/19/2020 3:30 PM   Critical care end time:  04/19/2020 3:35 PM   Critical care time was exclusive of:  Separately billable procedures and treating other patients   Critical care was necessary to treat or prevent imminent or life-threatening deterioration of the following conditions:  CNS failure or compromise   Critical care was time spent personally by me on the following activities:  Blood draw for specimens, development of treatment plan with patient or surrogate, discussions with consultants, evaluation of patient's response to treatment, examination of patient, obtaining history from patient or surrogate, ordering and performing treatments and interventions, ordering and review of laboratory studies, ordering and review of  radiographic studies, pulse oximetry, re-evaluation of patient's condition and review of old charts   (including critical care time)  Medications Ordered in ED Medications  LORazepam (ATIVAN) tablet 1-4 mg (has no administration in time range)    Or  LORazepam (ATIVAN) injection 1-4 mg (has no administration in time range)  thiamine tablet 100 mg (100 mg Oral Given 04/20/20 1058)    Or  thiamine (B-1) injection 100 mg ( Intravenous See Alternative 04/20/20 1058)  folic acid (FOLVITE) tablet 1 mg (1 mg Oral Given 04/20/20 1057)  multivitamin with minerals tablet 1 tablet (1 tablet Oral Given 04/20/20 1058)  enoxaparin (LOVENOX) injection 40 mg (40 mg Subcutaneous Given 04/20/20 0000)  polyethylene glycol (MIRALAX / GLYCOLAX) packet 17 g (has no administration in time range)  acetaminophen (TYLENOL) tablet 650 mg (650 mg Oral Given 04/20/20 0108)    Or  acetaminophen (TYLENOL) suppository 650 mg ( Rectal See Alternative 04/20/20 0108)  ondansetron (ZOFRAN) tablet 4 mg (has no administration in time range)    Or  ondansetron (ZOFRAN) injection 4 mg (has no administration in time range)  LORazepam (ATIVAN) tablet 0-4 mg (0 mg Oral Not Given 04/20/20 1121)    Followed by  LORazepam (ATIVAN) tablet 0-4 mg (has no administration in time range)  0.9 %  sodium chloride infusion ( Intravenous Rate/Dose Verify 04/20/20 0400)  potassium chloride (KLOR-CON) packet 40 mEq (40 mEq Oral Given 04/19/20 2210)  sodium chloride 0.9 % bolus 1,000 mL (1,000 mLs Intravenous Bolus from Bag 04/19/20 2210)  potassium chloride (KLOR-CON) packet 40 mEq (40 mEq Oral Given 04/20/20 1141)  magnesium sulfate 3 g in dextrose 5 % 100 mL IVPB (3 g Intravenous New Bag/Given 04/20/20 1106)    ED Course  I have reviewed the triage vital signs and the nursing notes.  Pertinent labs & imaging results that were available during my care of the patient were reviewed by me and considered in my medical decision making (see chart for  details).  Clinical Course as of Apr 20 1530  Sat Apr 19, 2020  2212 Reports no alcohol in two days  Alcohol, Ethyl (B): <10 [JS]  2223 Ketones, ur(!): 5 [JS]  2227 Lipase(!): 81 [JS]    Clinical Course User Index [JS] Claude Manges, PA-C   MDM Rules/Calculators/A&P    Patient with a past medical history of seizures from alcoholic  withdrawal presents to the ED for status post seizure per sister.  According to sister patient had a seizure today, has been "drinking "on a daily basis.  Patient reports he does not remember the incident, does sound like he might have had a seizure and then later woke up to EMS at his home.  Patient reports he has not had a drink in the past 2 days, when he drinks he usually does a 12 pack every other day.  To his chart which have extensively reviewed, patient has been seen here multiple times for alcohol withdrawals along with prior seizure activity.  Is currently not on any medication for seizure treatment as these are thought to be due to to his alcohol withdrawals.  During primary evaluation patient appears disheveled, pupils are pinpoint, neuro exam is unremarkable. He is able to provide somewhat of a history, does not recall the incident, some suspicion of loss of consciousness.  Denies any trauma that he is aware of.Lungs are clear to auscultation, chest is non tender to palpation, abdomen is soft, non tender to palpation.  Does appear anxious during evaluation along with tremors, CIWA score was obtained which was 5, he was provided with 1 mg of Ativan.  Only medication listed on his records it Librium, which she was written status post discharge from the hospital in January.  Rotation of his labs with a CBC without any leukocytosis, no signs of anemia.  CMP with some mild hyponatremia.  Potassium is 3.1, provided with oral replacement.  Creatinine level is within normal limits.  LFTs are chronically elevated as patient does abuse alcohol.  AST 2-1 2 ALT. Anion  gap is 21, suspect  From alcohol withdrawals. UDS negative for any illicit substance.  UA does show ketones, protein present.  A CT head was also obtained as patient did present with loss of consciousness.  CT head without any intracranial pathology.  10:31 PM Spoke to Dr. Alvester Morin, hospitalist service who will admit patient for further management of his alcohol withdrawals.  I have made an attempt to contact sister Thayer Ohm, unable to form her of patient's admission at this time.    Portions of this note were generated with Scientist, clinical (histocompatibility and immunogenetics). Dictation errors may occur despite best attempts at proofreading.  Final Clinical Impression(s) / ED Diagnoses Final diagnoses:  Alcohol withdrawal syndrome without complication [facility])    Rx / DC Orders ED Discharge Orders    None       Claude Manges, PA-C 04/19/20 2238    Claude Manges, PA-C 04/20/20 1531    Charlynne Pander, MD 04/20/20 1536

## 2020-04-20 DIAGNOSIS — E876 Hypokalemia: Secondary | ICD-10-CM | POA: Diagnosis present

## 2020-04-20 DIAGNOSIS — N179 Acute kidney failure, unspecified: Secondary | ICD-10-CM | POA: Diagnosis present

## 2020-04-20 DIAGNOSIS — F4310 Post-traumatic stress disorder, unspecified: Secondary | ICD-10-CM | POA: Diagnosis present

## 2020-04-20 DIAGNOSIS — G40909 Epilepsy, unspecified, not intractable, without status epilepticus: Secondary | ICD-10-CM | POA: Diagnosis present

## 2020-04-20 DIAGNOSIS — M6282 Rhabdomyolysis: Secondary | ICD-10-CM | POA: Diagnosis present

## 2020-04-20 DIAGNOSIS — Z79899 Other long term (current) drug therapy: Secondary | ICD-10-CM | POA: Diagnosis not present

## 2020-04-20 DIAGNOSIS — E869 Volume depletion, unspecified: Secondary | ICD-10-CM | POA: Diagnosis present

## 2020-04-20 DIAGNOSIS — Y900 Blood alcohol level of less than 20 mg/100 ml: Secondary | ICD-10-CM | POA: Diagnosis present

## 2020-04-20 DIAGNOSIS — D696 Thrombocytopenia, unspecified: Secondary | ICD-10-CM | POA: Diagnosis present

## 2020-04-20 DIAGNOSIS — R569 Unspecified convulsions: Secondary | ICD-10-CM | POA: Diagnosis present

## 2020-04-20 DIAGNOSIS — E871 Hypo-osmolality and hyponatremia: Secondary | ICD-10-CM | POA: Diagnosis present

## 2020-04-20 DIAGNOSIS — R748 Abnormal levels of other serum enzymes: Secondary | ICD-10-CM | POA: Diagnosis present

## 2020-04-20 DIAGNOSIS — F10230 Alcohol dependence with withdrawal, uncomplicated: Secondary | ICD-10-CM

## 2020-04-20 DIAGNOSIS — Z20822 Contact with and (suspected) exposure to covid-19: Secondary | ICD-10-CM | POA: Diagnosis present

## 2020-04-20 LAB — CBC
HCT: 41.4 % (ref 39.0–52.0)
Hemoglobin: 14.2 g/dL (ref 13.0–17.0)
MCH: 33.3 pg (ref 26.0–34.0)
MCHC: 34.3 g/dL (ref 30.0–36.0)
MCV: 97.2 fL (ref 80.0–100.0)
Platelets: 148 10*3/uL — ABNORMAL LOW (ref 150–400)
RBC: 4.26 MIL/uL (ref 4.22–5.81)
RDW: 11.8 % (ref 11.5–15.5)
WBC: 5.6 10*3/uL (ref 4.0–10.5)
nRBC: 0 % (ref 0.0–0.2)

## 2020-04-20 LAB — COMPREHENSIVE METABOLIC PANEL
ALT: 61 U/L — ABNORMAL HIGH (ref 0–44)
AST: 101 U/L — ABNORMAL HIGH (ref 15–41)
Albumin: 3.9 g/dL (ref 3.5–5.0)
Alkaline Phosphatase: 57 U/L (ref 38–126)
Anion gap: 9 (ref 5–15)
BUN: 14 mg/dL (ref 6–20)
CO2: 30 mmol/L (ref 22–32)
Calcium: 9.2 mg/dL (ref 8.9–10.3)
Chloride: 97 mmol/L — ABNORMAL LOW (ref 98–111)
Creatinine, Ser: 0.86 mg/dL (ref 0.61–1.24)
GFR calc Af Amer: >60 mL/min (ref >60)
GFR calc non Af Amer: >60 mL/min (ref >60)
Glucose, Bld: 78 mg/dL (ref 70–99)
Potassium: 3.6 mmol/L (ref 3.5–5.1)
Sodium: 136 mmol/L (ref 135–145)
Total Bilirubin: 2.2 mg/dL — ABNORMAL HIGH (ref 0.3–1.2)
Total Protein: 6.6 g/dL (ref 6.5–8.1)

## 2020-04-20 LAB — OSMOLALITY: Osmolality: 283 mOsm/kg (ref 275–295)

## 2020-04-20 LAB — PROTIME-INR
INR: 1 (ref 0.8–1.2)
Prothrombin Time: 12.7 seconds (ref 11.4–15.2)

## 2020-04-20 LAB — CK: Total CK: 1160 U/L — ABNORMAL HIGH (ref 49–397)

## 2020-04-20 LAB — APTT: aPTT: 25 seconds (ref 24–36)

## 2020-04-20 LAB — PHOSPHORUS
Phosphorus: 3 mg/dL (ref 2.5–4.6)
Phosphorus: 3.3 mg/dL (ref 2.5–4.6)

## 2020-04-20 LAB — SARS CORONAVIRUS 2 BY RT PCR (HOSPITAL ORDER, PERFORMED IN ~~LOC~~ HOSPITAL LAB): SARS Coronavirus 2: NEGATIVE

## 2020-04-20 LAB — MAGNESIUM
Magnesium: 1.4 mg/dL — ABNORMAL LOW (ref 1.7–2.4)
Magnesium: 1.7 mg/dL (ref 1.7–2.4)

## 2020-04-20 MED ORDER — MAGNESIUM SULFATE 50 % IJ SOLN
3.0000 g | Freq: Once | INTRAVENOUS | Status: AC
  Administered 2020-04-20: 3 g via INTRAVENOUS
  Filled 2020-04-20: qty 6

## 2020-04-20 NOTE — Progress Notes (Signed)
PROGRESS NOTE    Sean Fields  YDX:[id number] DOB: 05-20-1981 DOA: 04/19/2020 PCP: [facility]   Brief Narrative:  HPI per Dr. Dellia Beckwith on 04/19/20 Sean Fields is a 39 y.o. male with medical history significant of alcohol abuse, PTS, and previous seizures from alcohol withdrawal who presents to the hospital due to seizure activity. He was sitting on the couch when his sister reported that he had seizure like activity for ~12minutes while on the couch. She called EMS who brought the patient to the hospital. Patient does not remember the event at all, however he does have a history of seizures associated with alcohol withdrawal. On EMS arrival he was postictal and reportedly aggressive although he is now alert and oriented in no apparent distress. He does report alcohol use however denies drinking daily although admits to 6-8 beers when he does drink. His last drink was ~2 days ago. He has no complaints and is asking for something to eat. He denies any CP, SOB, N/V/D, fever, chills, dysuria, cough.   ED Course: In the ED patient remained seizure free with improvement in his mental status. He underwent CT head which revealed no acute abnormalities. Labs were significant for hypokalemia, hyponatremia, elevated creatinine from his baseline to 1.09, and negative EtOH. He was given 1 mg of ativan along with PO potassium, IVF in form of NS 1 L bolus, and 100 mg IV thiamine.   He will be admitted to hospitalist service with observation status for seizure secondary to alcohol withdrawal.   **Interim History  Shows some signs of withdrawal. CK Elevated. Continuing IVF   Assessment & Plan:   Active Problems:   Alcohol withdrawal (HCC)   Seizure (HCC)   Hyponatremia   Hypokalemia   AKI (acute kidney injury) (HCC)   PTSD (post-traumatic stress disorder)  Seizure -Likely recurrent secondary to alcohol withdrawal based on history  -UA negative, no s/s of infectious process -No further  seizure like activity in ED -Will place on seizure precautions as well as alcohol withdrawal protocol -Monitor on telemetry -Q4H neurochecks -Consider neurology evaluation/follow up given recurrent seizures although all have seemed provoked via EtOH withdrawal -Will check osmolality given elevated anion gap although likely related to prior seizure -Checking magnesium and will replete as needed -C/w NS at 100 mL/hr -Continue to Monitor for Further Seizure Activity  EtOH Withdrawal -Treating as above with alcohol withdrawal protocol and seizure precautions -Q4H neurochecks - ecommend close outpatient follow up with psychiatry as well given his history of PTSD -Checking mag and phos -Providing thiamine and folate replacement while in the hospital  -C/w CIWA Protocol  -Last CIWA was 4 and the one earlier 6 and having Tremors -C/w Folic Acid, MVI + Minerals and Thiamine  AKI, improved -Likely secondary to volume depletion -Received 1 L of IVF in ED and will continue maintenance fluid as above -BUN/Cr is now 14/0.86 -Avoid Nephrotoxic Medications, Contrast Dyes, Hypotension and Renally Adjust Medications -Repeat CMP in the AM   Abnormal LFT's -AST went from 152 -> 101 -ALT went from 86 -> 61 -Likely in the setting of Alcohol Withdrawal -Continue to Monitor and if not improving will obtain a RUQ U/S; Will obtain Hepatitis Panel -Repeat CMP in the AM   Hyponatremia -Likely complicated by volume depletion and history of EtOH abuse -IVF as above -Improved as Sodium went from 133 -> 136 -Repeat CBC in the AM   PTSD -Needs outpatient psychiatry follow up  Hypokalemia -Was 3.1 on Admission.  Secondary to poor oral intake/EtOH use -Checking mag and will replete to goal of 2; Now is 1.7 and given IV Mag Sulfate 3 Grams -Received 40 meq in ED and will given another 40 meq in AM and afternoon -Continue to Monitor and Replete as Necessary -Repeat CMP in the AM   Elevated CK/Mild  Rhabdomyolysis  -In the Setting of Seizure -CK was elevated to 1,160 and there was Moderate Hgb on Dipstick  Thrombocytopenia -Mild -In the Setting of Alcoholism -Patient's Platelet Count went from 178 -> 148 -Continue to Monitor for S/Sx of Bleeding; Currently no overt bleeding noted -Repeat CBC iin the AM   DVT prophylaxis: Enoxaparin 40 mg sq24h Code Status: FULL CODE  Family Communication: No family present at bedside  Disposition Plan: Call withdrawal and had an elevated CK so we ensure that it comes down prior to safe discharge disposition  Status is: Inpatient  Remains inpatient appropriate because:IV treatments appropriate due to intensity of illness or inability to take PO and Inpatient level of care appropriate due to severity of illness   Dispo: The patient is from: Home              Anticipated d/c is to: Home              Anticipated d/c date is: 1 day              Patient currently is not medically stable to d/c.  Consultants:   None   Procedures: None  Antimicrobials:  Anti-infectives (From admission, onward)   None     Subjective: Seen and examined at bedside and he was doing ok but states he was hurting. No CP or SOB. States he doesn't think he had a seizure but doesn't remember. No nausea or vomiting. No other concerns or complaints at this time.   Objective: Vitals:   04/20/20 0805 04/20/20 1109 04/20/20 1212 04/20/20 1616  BP: (!) 150/105 120/86 (!) 134/91 (!) 125/91  Pulse: 64 70 70 81  Resp: 20 16 20 20   Temp: 97.6 F (36.4 C) 98.5 F (36.9 C) 98.2 F (36.8 C) 98.5 F (36.9 C)  TempSrc: Oral Oral Oral Oral  SpO2: 100% 98% 97% 99%    Intake/Output Summary (Last 24 hours) at 04/20/2020 1801 Last data filed at 04/20/2020 1600 Gross per 24 hour  Intake 1390.19 ml  Output 700 ml  Net 690.19 ml   There were no vitals filed for this visit.  Examination: Physical Exam:  Constitutional: WN/WD Caucasian male in NAD and appears calm  Eyes:  Lids and conjunctivae normal, sclerae anicteric  ENMT: External Ears, Nose appear normal. Grossly normal hearing.  Neck: Appears normal, supple, no cervical masses, normal ROM, no appreciable thyromegaly; no JVD Respiratory: Diminished to auscultation bilaterally, no wheezing, rales, rhonchi or crackles. Normal respiratory effort and patient is not tachypenic. No accessory muscle use. Unlabored breathing  Cardiovascular: RRR, no murmurs / rubs / gallops. S1 and S2 auscultated.  Abdomen: Soft, non-tender, non-distended. Bowel sounds positive.  GU: Deferred. Musculoskeletal: No clubbing / cyanosis of digits/nails. No joint deformity upper and lower extremities.  Skin: No rashes, lesions, ulcers but has multiple tattoos scattered throughout his body. No induration; Warm and dry.  Neurologic: CN 2-12 grossly intact with no focal deficits. Romberg sign cerebellar reflexes not assessed.  Psychiatric: Normal judgment and insight. Alert and oriented x 3. Normal mood and appropriate affect.   Data Reviewed: I have personally reviewed following labs and imaging studies  CBC:  Recent Labs  Lab 04/19/20 1907 04/20/20 0532  WBC 6.4 5.6  NEUTROABS 5.1  --   HGB 15.3 14.2  HCT 43.8 41.4  MCV 96.3 97.2  PLT 178 148*   Basic Metabolic Panel: Recent Labs  Lab 04/19/20 1907 04/20/20 0023 04/20/20 0532  NA 133*  --  136  K 3.1*  --  3.6  CL 86*  --  97*  CO2 26  --  30  GLUCOSE 75  --  78  BUN 14  --  14  CREATININE 1.09  --  0.86  CALCIUM 10.2  --  9.2  MG  --  1.4* 1.7  PHOS  --  3.0 3.3   GFR: CrCl cannot be calculated (Unknown ideal weight.). Liver Function Tests: Recent Labs  Lab 04/19/20 1907 04/20/20 0532  AST 152* 101*  ALT 86* 61*  ALKPHOS 67 57  BILITOT 3.4* 2.2*  PROT 8.0 6.6  ALBUMIN 5.1* 3.9   Recent Labs  Lab 04/19/20 1915  LIPASE 81*   No results for input(s): AMMONIA in the last 168 hours. Coagulation Profile: Recent Labs  Lab 04/20/20 0023  INR 1.0    Cardiac Enzymes: Recent Labs  Lab 04/20/20 0023  CKTOTAL 1,160*   BNP (last 3 results) No results for input(s): PROBNP in the last 8760 hours. HbA1C: No results for input(s): HGBA1C in the last 72 hours. CBG: No results for input(s): GLUCAP in the last 168 hours. Lipid Profile: No results for input(s): CHOL, HDL, LDLCALC, TRIG, CHOLHDL, LDLDIRECT in the last 72 hours. Thyroid Function Tests: No results for input(s): TSH, T4TOTAL, FREET4, T3FREE, THYROIDAB in the last 72 hours. Anemia Panel: No results for input(s): VITAMINB12, FOLATE, FERRITIN, TIBC, IRON, RETICCTPCT in the last 72 hours. Sepsis Labs: No results for input(s): PROCALCITON, LATICACIDVEN in the last 168 hours.  Recent Results (from the past 240 hour(s))  SARS Coronavirus 2 by RT PCR (hospital order, performed in [facility] hospital lab) Nasopharyngeal Nasopharyngeal Swab     Status: None   Collection Time: 04/19/20 10:25 PM   Specimen: Nasopharyngeal Swab  Result Value Ref Range Status   SARS Coronavirus 2 NEGATIVE NEGATIVE Final    Comment: (NOTE) SARS-CoV-2 target nucleic acids are NOT DETECTED. The SARS-CoV-2 RNA is generally detectable in upper and lower respiratory specimens during the acute phase of infection. The lowest concentration of SARS-CoV-2 viral copies this assay can detect is 250 copies / mL. A negative result does not preclude SARS-CoV-2 infection and should not be used as the sole basis for treatment or other patient management decisions.  A negative result may occur with improper specimen collection / handling, submission of specimen other than nasopharyngeal swab, presence of viral mutation(s) within the areas targeted by this assay, and inadequate number of viral copies (<250 copies / mL). A negative result must be combined with clinical observations, patient history, and epidemiological information. Fact Sheet for Patients:   BoilerBrush.com.cy Fact Sheet for  Healthcare Providers: https://pope.com/ This test is not yet approved or cleared  by the [place] FDA and has been authorized for detection and/or diagnosis of SARS-CoV-2 by FDA under an Emergency Use Authorization (EUA).  This EUA will remain in effect (meaning this test can be used) for the duration of the COVID-19 declaration under Section 564(b)(1) of the Act, 21 U.S.C. section 360bbb-3(b)(1), unless the authorization is terminated or revoked sooner. Performed at [facility], [address], [place], [place] [address]     RN Pressure Injury  Documentation:     Estimated body mass index is 25.56 kg/m as calculated from the following:   Height as of 11/30/19: 5\' 7"  (1.702 m).   Weight as of 12/01/19: 74 kg.  Malnutrition Type:      Malnutrition Characteristics:      Nutrition Interventions:      Radiology Studies: CT Head Wo Contrast  Result Date: 04/19/2020 CLINICAL DATA:  Status post seizure. EXAM: CT HEAD WITHOUT CONTRAST TECHNIQUE: Contiguous axial images were obtained from the base of the skull through the vertex without intravenous contrast. COMPARISON:  November 30, 2019 FINDINGS: Brain: No evidence of acute infarction, hemorrhage, hydrocephalus, extra-axial collection or mass lesion/mass effect. Vascular: No hyperdense vessel or unexpected calcification. Skull: Normal. Negative for fracture or focal lesion. Sinuses/Orbits: No acute finding. Other: None. IMPRESSION: No acute intracranial pathology. Electronically Signed   By: December 02, 2019 M.D.   On: 04/19/2020 20:41   Scheduled Meds: . enoxaparin (LOVENOX) injection  40 mg Subcutaneous QHS  . folic acid  1 mg Oral Daily  . LORazepam  0-4 mg Oral Q6H   Followed by  . [START ON 04/22/2020] LORazepam  0-4 mg Oral Q12H  . multivitamin with minerals  1 tablet Oral Daily  . thiamine  100 mg Oral Daily   Or  . thiamine  100 mg Intravenous Daily   Continuous  Infusions: . sodium chloride 100 mL/hr at 04/20/20 0400    LOS: 0 days   04/22/20, DO [facility] PAGER is on AMION  If 7PM-7AM, please contact night-coverage [email or web address]

## 2020-04-20 NOTE — Progress Notes (Addendum)
These vital signs will be considered part of admission vitals and CIWA.   Vitals:   04/20/20 1109 04/20/20 1212  BP: 120/86 (!) 134/91  Pulse: 70 70  Resp: 16 20  Temp: 98.5 F (36.9 C) 98.2 F (36.8 C)  SpO2: 98% 97%

## 2020-04-21 DIAGNOSIS — N179 Acute kidney failure, unspecified: Secondary | ICD-10-CM

## 2020-04-21 LAB — CK: Total CK: 413 U/L — ABNORMAL HIGH (ref 49–397)

## 2020-04-21 LAB — COMPREHENSIVE METABOLIC PANEL
ALT: 47 U/L — ABNORMAL HIGH (ref 0–44)
AST: 62 U/L — ABNORMAL HIGH (ref 15–41)
Albumin: 3.5 g/dL (ref 3.5–5.0)
Alkaline Phosphatase: 49 U/L (ref 38–126)
Anion gap: 10 (ref 5–15)
BUN: 8 mg/dL (ref 6–20)
CO2: 22 mmol/L (ref 22–32)
Calcium: 8.6 mg/dL — ABNORMAL LOW (ref 8.9–10.3)
Chloride: 103 mmol/L (ref 98–111)
Creatinine, Ser: 0.67 mg/dL (ref 0.61–1.24)
GFR calc Af Amer: >60 mL/min (ref >60)
GFR calc non Af Amer: >60 mL/min (ref >60)
Glucose, Bld: 103 mg/dL — ABNORMAL HIGH (ref 70–99)
Potassium: 3.8 mmol/L (ref 3.5–5.1)
Sodium: 135 mmol/L (ref 135–145)
Total Bilirubin: 0.9 mg/dL (ref 0.3–1.2)
Total Protein: 5.8 g/dL — ABNORMAL LOW (ref 6.5–8.1)

## 2020-04-21 LAB — CBC WITH DIFFERENTIAL/PLATELET
Abs Immature Granulocytes: 0.01 10*3/uL (ref 0.00–0.07)
Basophils Absolute: 0 10*3/uL (ref 0.0–0.1)
Basophils Relative: 0 %
Eosinophils Absolute: 0.1 10*3/uL (ref 0.0–0.5)
Eosinophils Relative: 2 %
HCT: 39.8 % (ref 39.0–52.0)
Hemoglobin: 13.3 g/dL (ref 13.0–17.0)
Immature Granulocytes: 0 %
Lymphocytes Relative: 28 %
Lymphs Abs: 1.6 10*3/uL (ref 0.7–4.0)
MCH: 33.2 pg (ref 26.0–34.0)
MCHC: 33.4 g/dL (ref 30.0–36.0)
MCV: 99.3 fL (ref 80.0–100.0)
Monocytes Absolute: 0.6 10*3/uL (ref 0.1–1.0)
Monocytes Relative: 10 %
Neutro Abs: 3.3 10*3/uL (ref 1.7–7.7)
Neutrophils Relative %: 60 %
Platelets: 130 10*3/uL — ABNORMAL LOW (ref 150–400)
RBC: 4.01 MIL/uL — ABNORMAL LOW (ref 4.22–5.81)
RDW: 11.6 % (ref 11.5–15.5)
WBC: 5.6 10*3/uL (ref 4.0–10.5)
nRBC: 0 % (ref 0.0–0.2)

## 2020-04-21 LAB — HEPATITIS PANEL, ACUTE
HCV Ab: NONREACTIVE
Hep A IgM: NONREACTIVE
Hep B C IgM: NONREACTIVE
Hepatitis B Surface Ag: NONREACTIVE

## 2020-04-21 LAB — MAGNESIUM: Magnesium: 1.8 mg/dL (ref 1.7–2.4)

## 2020-04-21 LAB — PHOSPHORUS: Phosphorus: 3.6 mg/dL (ref 2.5–4.6)

## 2020-04-21 MED ORDER — THIAMINE HCL 100 MG PO TABS: 100.0000 mg | ORAL_TABLET | Freq: Every day | ORAL | 0 refills | Status: AC

## 2020-04-21 MED ORDER — FOLIC ACID 1 MG PO TABS: 1.0000 mg | ORAL_TABLET | Freq: Every day | ORAL | 0 refills | Status: AC

## 2020-04-21 MED ORDER — MAGNESIUM SULFATE 2 GM/50ML IV SOLN
2.0000 g | Freq: Once | INTRAVENOUS | Status: AC
  Administered 2020-04-21: 2 g via INTRAVENOUS
  Filled 2020-04-21: qty 50

## 2020-04-21 MED ORDER — ADULT MULTIVITAMIN W/MINERALS CH: 1.0000 | ORAL_TABLET | Freq: Every day | ORAL | 0 refills | Status: AC

## 2020-04-21 MED ORDER — POLYETHYLENE GLYCOL 3350 17 G PO PACK: 17.0000 g | PACK | Freq: Every day | ORAL | 0 refills | Status: AC | PRN

## 2020-04-21 NOTE — TOC Transition Note (Signed)
Transition of Care [facility]) - CM/SW Discharge Note   Patient Details  Name: Sean Fields MRN: [id number] Date of Birth: Jan 15, 1981  [facility] CM/SW Contact:  Trish Mage, LCSW Phone Number: 04/21/2020, 10:15 AM   Clinical Narrative:  Met with patient in follow up to MD consult for SA.  Patient lives here in [place] apartment [address]-recently moved.  States he is here "because my sister thought I had a seizure, and she called paramedics."  Denies seizure-states he fell asleep and sister overreacted.  Denies substance abuse issues.  Cited a comprehensive support system including family and other veterans.  Uses [facility] as his medical clinic; goes to [facility] if he needs acute services.  No other needs identified.  TOC sign off.     Final next level of care: Home/Self Care Barriers to Discharge: No Barriers Identified   Patient Goals and CMS Choice Patient states their goals for this hospitalization and ongoing recovery are:: "I'm ready to go home.  I didn't need to be admitted."      Discharge Placement                       Discharge Plan and Services                                     Social Determinants of Health (SDOH) Interventions     Readmission Risk Interventions No flowsheet data found.

## 2020-04-21 NOTE — Plan of Care (Signed)
  Problem: Clinical Measurements: Goal: Respiratory complications will improve Outcome: Adequate for Discharge   Problem: Clinical Measurements: Goal: Cardiovascular complication will be avoided Outcome: Adequate for Discharge   Problem: Education: Goal: Knowledge of General Education information will improve Description: Including pain rating scale, medication(s)/side effects and non-pharmacologic comfort measures Outcome: Adequate for Discharge   

## 2020-04-21 NOTE — Discharge Summary (Signed)
Physician Discharge Summary  Sean Fields WUJ:[id number] DOB: Apr 10, 1981 DOA: 04/19/2020  PCP: [facility]  Admit date: 04/19/2020 Discharge date: 04/21/2020  Admitted From: Home Disposition: Home  Recommendations for Outpatient Follow-up:  1. Follow up with PCP in 1-2 weeks 2. Avoid Alcohol  3. Follow up with Psychiatry in the outpatient setting  4. Follow-up with neurology in outpatient setting if necessary 5. No Driving for at least 6 months since patient had a seizure PTA 6. Please obtain CMP/CBC, Mag, Phos in one week 7. Please follow up on the following pending results:  Home Health: No  Equipment/Devices: None    Discharge Condition: Stable  CODE STATUS: FULL CODE  Diet recommendation: Regular Diet   Brief/Interim Summary: HPI per Dr. Dellia Beckwithhomas Bell on 04/19/20 Margit BandaJohn M Sandlinis a 39 y.o.malewith medical history significant ofalcohol abuse, PTS, and previous seizures from alcohol withdrawal who presents to the hospital due to seizure activity. He was sitting on the couch when his sister reported that he had seizure like activity for ~222minutes while on the couch. She called EMS who brought the patient to the hospital. Patient does not remember the event at all, however he does have a history of seizures associated with alcohol withdrawal. On EMS arrival he was postictal and reportedly aggressive although he is now alert and oriented in no apparent distress. He does report alcohol use however denies drinking daily although admits to 6-8 beers when he does drink. His last drink was ~2 days ago. He has no complaints and is asking for something to eat. He denies any CP, SOB, N/V/D, fever, chills, dysuria, cough.  ED Course:In the ED patient remained seizure free with improvement in his mental status. He underwent CT head which revealed no acute abnormalities. Labs were significant for hypokalemia, hyponatremia, elevated creatinine from his baseline to 1.09, and negative EtOH.  He was given 1 mg of ativan along with PO potassium, IVF in form of NS 1 L bolus, and 100 mg IV thiamine.   He will be admitted to hospitalist service with observation status for seizure secondary to alcohol withdrawal.  **Interim History  Shows some signs of withdrawal. CK Elevated. Continuing IVF and he is improved and his CIWA scores have been less than 5 consistently.  He is stable for discharge as his CK has trended down as well.  Will need to follow-up with PCP and avoid alcohol and he was advised to have no driving since he had seizure-like activity prior to admission.  Need to follow-up with PCP for further management post hospital discharge.  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 based on history  -UA negative, no s/s of infectious process -No further seizure like activity in ED -Will place on seizure precautions as well as alcohol withdrawal protocol -Monitor on telemetry -Q4H neurochecks -Consider neurology evaluation/follow up given recurrent seizures although all have seemed provoked via EtOH withdrawal -Will check osmolality given elevated anion gap although likely related to prior seizure -Checking magnesium and will replete as needed -C/w NS at 100 mL/hr hospitalized -Continue to Monitor for Further Seizure Activity and he had none so he is stable for discharge and advised no driving next-we will need to follow-up with neurology in outpatient setting if necessary  EtOH Withdrawal -Treating as above with alcohol withdrawal protocol  and seizure precautions -Q4H neurochecks - ecommend close outpatient follow up with psychiatry as well given his history of PTSD -Checking mag and phos -Providing thiamine and folate replacement while in the hospital  -C/w CIWA  Protocol  -Last CIWA was 4 and the one earlier 6 and having Tremors yesterday but today he is doing well and his CIWA scores have been under 5 -C/w Folic Acid, MVI + Minerals and Thiamine  AKI, improved -Likely secondary to volume depletion -Received 1 L of IVF in ED and will continue maintenance fluid as above -BUN/Cr is now improved and back to baseline as 8/0.67 -Avoid Nephrotoxic Medications, Contrast Dyes, Hypotension and Renally Adjust Medications -Repeat CMP in the AM   Abnormal LFT's -AST went from 152 -> 101 -> 62 -ALT went from 86 -> 61 -> 47 -Likely in the setting of Alcohol Withdrawal -Continue to Monitor and if not improving will obtain a RUQ U/S; Will obtain Hepatitis Panel -Repeat CMP in the AM   Hyponatremia -Likely complicated by volume depletion and history of EtOH abuse -IVF as above -Improved as Sodium went from 133 -> 136 -> 135 -Repeat CBC in the AM   PTSD -Needs outpatient psychiatry follow up  Hypokalemia -Was 3.1 on Admission. Secondary to poor oral intake/EtOH use -Checking mag and will replete to goal of 2; Now is 1.7 and given IV Mag Sulfate 3 Grams -Received 40 meq in ED and will given another 40 meq in AM and afternoon -Continue to Monitor and Replete as Necessary -Repeat CMP in the AM   Elevated CK/Mild Rhabdomyolysis  -In the Setting of Seizure -CK was elevated to 1,160 and there was Moderate Hgb on Dipstick -Now CK is improved and is 413  -Encourage oral hydration  Thrombocytopenia -Mild -In the Setting of Alcoholism -Patient's Platelet Count went from 178 -> 148 -> 130 -Continue to Monitor for S/Sx of Bleeding; Currently no overt bleeding noted -Repeat CBC within 1 week at PCPs office  Discharge Instructions  Discharge Instructions    Call MD for:  difficulty breathing, headache or visual disturbances   Complete by: As directed    Call MD for:  extreme fatigue   Complete by: As directed    Call MD for:  hives   Complete  by: As directed    Call MD for:  persistant dizziness or light-headedness   Complete by: As directed    Call MD for:  persistant nausea and vomiting   Complete by: As directed    Call MD for:  redness, tenderness, or signs of infection (pain, swelling, redness, odor or green/yellow discharge around incision site)   Complete by: As directed    Call MD for:  severe uncontrolled pain   Complete by: As directed    Call MD for:  temperature >100.4   Complete by: As directed    Diet general   Complete by: As directed    Discharge instructions   Complete by: As directed    You were cared for by a hospitalist during your hospital stay. If you have any questions about your discharge medications or the care you received while you were in the hospital after you are discharged, you can call the unit and ask to speak with the hospitalist on call if the hospitalist that took care of you is not available. Once you are discharged, your primary care physician will handle any further medical issues. Please note that NO REFILLS for any discharge medications will be authorized once you  are discharged, as it is imperative that you return to your primary care physician (or establish a relationship with a primary care physician if you do not have one) for your aftercare needs so that they can reassess your need for medications and monitor your lab values.  Follow up with PCP and avoid alcohol. Take all medications as prescribed. If symptoms change or worsen please return to the ED for evaluation   Driving Restrictions   Complete by: As directed    No Driving for at least 6 months until seizure free   Increase activity slowly   Complete by: As directed      Allergies as of 04/21/2020      Reactions   Smallpox Vaccine Other (See Comments)   unkown      Medication List    STOP taking these medications   chlordiazePOXIDE 25 MG capsule Commonly known as: LIBRIUM     TAKE these medications   folic acid 1  MG tablet Commonly known as: FOLVITE Take 1 tablet (1 mg total) by mouth daily. Start taking on: Apr 22, 2020   multivitamin with minerals Tabs tablet Take 1 tablet by mouth daily. Start taking on: Apr 22, 2020   polyethylene glycol 17 g packet Commonly known as: MIRALAX / GLYCOLAX Take 17 g by mouth daily as needed for mild constipation.   thiamine 100 MG tablet Take 1 tablet (100 mg total) by mouth daily. Start taking on: Apr 22, 2020       Allergies  Allergen Reactions  . Smallpox Vaccine Other (See Comments)    unkown   Consultations:  None  Procedures/Studies: CT Head Wo Contrast  Result Date: 04/19/2020 CLINICAL DATA:  Status post seizure. EXAM: CT HEAD WITHOUT CONTRAST TECHNIQUE: Contiguous axial images were obtained from the base of the skull through the vertex without intravenous contrast. COMPARISON:  November 30, 2019 FINDINGS: Brain: No evidence of acute infarction, hemorrhage, hydrocephalus, extra-axial collection or mass lesion/mass effect. Vascular: No hyperdense vessel or unexpected calcification. Skull: Normal. Negative for fracture or focal lesion. Sinuses/Orbits: No acute finding. Other: None. IMPRESSION: No acute intracranial pathology. Electronically Signed   By: Aram Candela M.D.   On: 04/19/2020 20:41    Subjective: Seen and examined at bedside he is doing well.  No nausea or vomiting.  Felt well and had no no signs of withdrawal.  Denies any pain.  No other concerns or complaints at this time.  Discharge Exam: Vitals:   04/20/20 2358 04/21/20 0412  BP: (!) 128/98 (!) 137/91  Pulse: 80 (!) 58  Resp: 20 20  Temp: 98.2 F (36.8 C) 98 F (36.7 C)  SpO2: 98% 97%   Vitals:   04/20/20 1841 04/20/20 1931 04/20/20 2358 04/21/20 0412  BP:  (!) 136/91 (!) 128/98 (!) 137/91  Pulse:  78 80 (!) 58  Resp:  19 20 20   Temp:  98.7 F (37.1 C) 98.2 F (36.8 C) 98 F (36.7 C)  TempSrc:  Oral Oral Oral  SpO2:  100% 98% 97%  Weight: 76.4 kg     Height:  5\' 7"  (1.702 m)      General: Pt is alert, awake, not in acute distress Cardiovascular: RRR, S1/S2 +, no rubs, no gallops Respiratory: Diminished bilaterally, no wheezing, no rhonchi Abdominal: Soft, NT, ND, bowel sounds + Extremities: no edema, no cyanosis: Has multiple tattoos scattered diffusely throughout his body  The results of significant diagnostics from this hospitalization (including imaging, microbiology, ancillary and laboratory) are listed  below for reference.    Microbiology: Recent Results (from the past 240 hour(s))  SARS Coronavirus 2 by RT PCR (hospital order, performed in [facility] hospital lab) Nasopharyngeal Nasopharyngeal Swab     Status: None   Collection Time: 04/19/20 10:25 PM   Specimen: Nasopharyngeal Swab  Result Value Ref Range Status   SARS Coronavirus 2 NEGATIVE NEGATIVE Final    Comment: (NOTE) SARS-CoV-2 target nucleic acids are NOT DETECTED. The SARS-CoV-2 RNA is generally detectable in upper and lower respiratory specimens during the acute phase of infection. The lowest concentration of SARS-CoV-2 viral copies this assay can detect is 250 copies / mL. A negative result does not preclude SARS-CoV-2 infection and should not be used as the sole basis for treatment or other patient management decisions.  A negative result may occur with improper specimen collection / handling, submission of specimen other than nasopharyngeal swab, presence of viral mutation(s) within the areas targeted by this assay, and inadequate number of viral copies (<250 copies / mL). A negative result must be combined with clinical observations, patient history, and epidemiological information. Fact Sheet for Patients:   StrictlyIdeas.no Fact Sheet for Healthcare Providers: BankingDealers.co.za This test is not yet approved or cleared  by the [place] FDA and has been authorized for detection and/or diagnosis of SARS-CoV-2  by FDA under an Emergency Use Authorization (EUA).  This EUA will remain in effect (meaning this test can be used) for the duration of the COVID-19 declaration under Section 564(b)(1) of the Act, 21 U.S.C. section 360bbb-3(b)(1), unless the authorization is terminated or revoked sooner. Performed at [facility], [place] [address], [place], [place] [address]     Labs: BNP (last 3 results) No results for input(s): BNP in the last 8760 hours. Basic Metabolic Panel: Recent Labs  Lab 04/19/20 1907 04/20/20 0023 04/20/20 0532 04/21/20 0452  NA 133*  --  136 135  K 3.1*  --  3.6 3.8  CL 86*  --  97* 103  CO2 26  --  30 22  GLUCOSE 75  --  78 103*  BUN 14  --  14 8  CREATININE 1.09  --  0.86 0.67  CALCIUM 10.2  --  9.2 8.6*  MG  --  1.4* 1.7 1.8  PHOS  --  3.0 3.3 3.6   Liver Function Tests: Recent Labs  Lab 04/19/20 1907 04/20/20 0532 04/21/20 0452  AST 152* 101* 62*  ALT 86* 61* 47*  ALKPHOS 67 57 49  BILITOT 3.4* 2.2* 0.9  PROT 8.0 6.6 5.8*  ALBUMIN 5.1* 3.9 3.5   Recent Labs  Lab 04/19/20 1915  LIPASE 81*   No results for input(s): AMMONIA in the last 168 hours. CBC: Recent Labs  Lab 04/19/20 1907 04/20/20 0532 04/21/20 0452  WBC 6.4 5.6 5.6  NEUTROABS 5.1  --  3.3  HGB 15.3 14.2 13.3  HCT 43.8 41.4 39.8  MCV 96.3 97.2 99.3  PLT 178 148* 130*   Cardiac Enzymes: Recent Labs  Lab 04/20/20 0023 04/21/20 0452  CKTOTAL 1,160* 413*   BNP: Invalid input(s): POCBNP CBG: No results for input(s): GLUCAP in the last 168 hours. D-Dimer No results for input(s): DDIMER in the last 72 hours. Hgb A1c No results for input(s): HGBA1C in the last 72 hours. Lipid Profile No results for input(s): CHOL, HDL, LDLCALC, TRIG, CHOLHDL, LDLDIRECT in the last 72 hours. Thyroid function studies No results for input(s): TSH, T4TOTAL, T3FREE, THYROIDAB in the last 72 hours.  Invalid input(s): FREET3 Anemia work up No results for input(s): VITAMINB12,  FOLATE, FERRITIN, TIBC, IRON, RETICCTPCT in the last 72 hours. Urinalysis    Component Value Date/Time   COLORURINE YELLOW 04/19/2020 1938   APPEARANCEUR CLEAR 04/19/2020 1938   LABSPEC 1.013 04/19/2020 1938   PHURINE 6.0 04/19/2020 1938   GLUCOSEU NEGATIVE 04/19/2020 1938   HGBUR MODERATE (A) 04/19/2020 1938   BILIRUBINUR NEGATIVE 04/19/2020 1938   KETONESUR 5 (A) 04/19/2020 1938   PROTEINUR 100 (A) 04/19/2020 1938   NITRITE NEGATIVE 04/19/2020 1938   LEUKOCYTESUR NEGATIVE 04/19/2020 1938   Sepsis Labs Invalid input(s): PROCALCITONIN,  WBC,  LACTICIDVEN Microbiology Recent Results (from the past 240 hour(s))  SARS Coronavirus 2 by RT PCR (hospital order, performed in [facility] hospital lab) Nasopharyngeal Nasopharyngeal Swab     Status: None   Collection Time: 04/19/20 10:25 PM   Specimen: Nasopharyngeal Swab  Result Value Ref Range Status   SARS Coronavirus 2 NEGATIVE NEGATIVE Final    Comment: (NOTE) SARS-CoV-2 target nucleic acids are NOT DETECTED. The SARS-CoV-2 RNA is generally detectable in upper and lower respiratory specimens during the acute phase of infection. The lowest concentration of SARS-CoV-2 viral copies this assay can detect is 250 copies / mL. A negative result does not preclude SARS-CoV-2 infection and should not be used as the sole basis for treatment or other patient management decisions.  A negative result may occur with improper specimen collection / handling, submission of specimen other than nasopharyngeal swab, presence of viral mutation(s) within the areas targeted by this assay, and inadequate number of viral copies (<250 copies / mL). A negative result must be combined with clinical observations, patient history, and epidemiological information. Fact Sheet for Patients:   BoilerBrush.com.cy Fact Sheet for Healthcare Providers: https://pope.com/ This test is not yet approved or cleared  by the  [place] FDA and has been authorized for detection and/or diagnosis of SARS-CoV-2 by FDA under an Emergency Use Authorization (EUA).  This EUA will remain in effect (meaning this test can be used) for the duration of the COVID-19 declaration under Section 564(b)(1) of the Act, 21 U.S.C. section 360bbb-3(b)(1), unless the authorization is terminated or revoked sooner. Performed at [facility], [address] [place], [place] [address]    Time coordinating discharge: 35 minutes  SIGNED:  Merlene Laughter, DO [facility] 04/21/2020, 7:47 PM Pager is on AMION  If 7PM-7AM, please contact night-coverage [email or web address]

## 2020-12-10 ENCOUNTER — Emergency Department (HOSPITAL_COMMUNITY): Payer: No Typology Code available for payment source

## 2020-12-10 ENCOUNTER — Other Ambulatory Visit: Payer: Self-pay

## 2020-12-10 ENCOUNTER — Encounter (HOSPITAL_COMMUNITY): Payer: Self-pay | Admitting: Pharmacy Technician

## 2020-12-10 ENCOUNTER — Inpatient Hospital Stay (HOSPITAL_COMMUNITY)
Admission: EM | Admit: 2020-12-10 | Discharge: 2020-12-12 | DRG: 501 | Disposition: A | Discharge status: Home / Self Care | Payer: No Typology Code available for payment source | Attending: Internal Medicine | Admitting: Internal Medicine

## 2020-12-10 ENCOUNTER — Encounter (INDEPENDENT_AMBULATORY_CARE_PROVIDER_SITE_OTHER): Payer: Self-pay | Admitting: Physician Assistant

## 2020-12-10 DIAGNOSIS — F10239 Alcohol dependence with withdrawal, unspecified: Secondary | ICD-10-CM | POA: Diagnosis present

## 2020-12-10 DIAGNOSIS — T1490XA Injury, unspecified, initial encounter: Secondary | ICD-10-CM

## 2020-12-10 DIAGNOSIS — M25512 Pain in left shoulder: Secondary | ICD-10-CM

## 2020-12-10 DIAGNOSIS — S99922A Unspecified injury of left foot, initial encounter: Secondary | ICD-10-CM

## 2020-12-10 DIAGNOSIS — F10920 Alcohol use, unspecified with intoxication, uncomplicated: Secondary | ICD-10-CM

## 2020-12-10 DIAGNOSIS — I959 Hypotension, unspecified: Secondary | ICD-10-CM

## 2020-12-10 DIAGNOSIS — F4310 Post-traumatic stress disorder, unspecified: Secondary | ICD-10-CM | POA: Diagnosis present

## 2020-12-10 DIAGNOSIS — F10220 Alcohol dependence with intoxication, uncomplicated: Secondary | ICD-10-CM | POA: Diagnosis present

## 2020-12-10 DIAGNOSIS — Z87820 Personal history of traumatic brain injury: Secondary | ICD-10-CM | POA: Diagnosis not present

## 2020-12-10 DIAGNOSIS — G312 Degeneration of nervous system due to alcohol: Secondary | ICD-10-CM | POA: Diagnosis present

## 2020-12-10 DIAGNOSIS — E872 Acidosis: Secondary | ICD-10-CM | POA: Diagnosis present

## 2020-12-10 DIAGNOSIS — S91322A Laceration with foreign body, left foot, initial encounter: Secondary | ICD-10-CM | POA: Diagnosis present

## 2020-12-10 DIAGNOSIS — Z7141 Alcohol abuse counseling and surveillance of alcoholic: Secondary | ICD-10-CM

## 2020-12-10 DIAGNOSIS — Z20822 Contact with and (suspected) exposure to covid-19: Secondary | ICD-10-CM | POA: Diagnosis present

## 2020-12-10 DIAGNOSIS — S92352B Displaced fracture of fifth metatarsal bone, left foot, initial encounter for open fracture: Secondary | ICD-10-CM | POA: Diagnosis present

## 2020-12-10 DIAGNOSIS — Z23 Encounter for immunization: Secondary | ICD-10-CM | POA: Diagnosis not present

## 2020-12-10 DIAGNOSIS — Y908 Blood alcohol level of 240 mg/100 ml or more: Secondary | ICD-10-CM | POA: Diagnosis present

## 2020-12-10 DIAGNOSIS — F17210 Nicotine dependence, cigarettes, uncomplicated: Secondary | ICD-10-CM | POA: Diagnosis present

## 2020-12-10 DIAGNOSIS — E860 Dehydration: Secondary | ICD-10-CM | POA: Diagnosis present

## 2020-12-10 DIAGNOSIS — Z5900 Homelessness unspecified: Secondary | ICD-10-CM

## 2020-12-10 DIAGNOSIS — F10939 Alcohol use, unspecified with withdrawal, unspecified: Secondary | ICD-10-CM | POA: Diagnosis present

## 2020-12-10 DIAGNOSIS — F10230 Alcohol dependence with withdrawal, uncomplicated: Secondary | ICD-10-CM | POA: Diagnosis not present

## 2020-12-10 HISTORY — DX: Post-traumatic stress disorder, unspecified: F43.10

## 2020-12-10 LAB — COMPREHENSIVE METABOLIC PANEL
ALT: 26 U/L (ref 0–44)
ALT: 26 U/L (ref 0–44)
AST: 45 U/L — ABNORMAL HIGH (ref 15–41)
AST: 43 U/L — ABNORMAL HIGH (ref 15–41)
Albumin: 3.5 g/dL (ref 3.5–5.0)
Albumin: 3.6 g/dL (ref 3.5–5.0)
Alkaline Phosphatase: 55 U/L (ref 38–126)
Alkaline Phosphatase: 58 U/L (ref 38–126)
Anion gap: 12 (ref 5–15)
Anion gap: 13 (ref 5–15)
BUN: 13 mg/dL (ref 6–20)
BUN: 9 mg/dL (ref 6–20)
CO2: 24 mmol/L (ref 22–32)
CO2: 24 mmol/L (ref 22–32)
Calcium: 8.5 mg/dL — ABNORMAL LOW (ref 8.9–10.3)
Calcium: 8.5 mg/dL — ABNORMAL LOW (ref 8.9–10.3)
Chloride: 108 mmol/L (ref 98–111)
Chloride: 108 mmol/L (ref 98–111)
Creatinine, Ser: 0.87 mg/dL (ref 0.61–1.24)
Creatinine, Ser: 0.83 mg/dL (ref 0.61–1.24)
GFR, Estimated: >60 mL/min (ref >60)
GFR, Estimated: >60 mL/min (ref >60)
Glucose, Bld: 118 mg/dL — ABNORMAL HIGH (ref 70–99)
Glucose, Bld: 101 mg/dL — ABNORMAL HIGH (ref 70–99)
Potassium: 3.6 mmol/L (ref 3.5–5.1)
Potassium: 3.7 mmol/L (ref 3.5–5.1)
Sodium: 144 mmol/L (ref 135–145)
Sodium: 145 mmol/L (ref 135–145)
Total Bilirubin: 0.5 mg/dL (ref 0.3–1.2)
Total Bilirubin: 1.1 mg/dL (ref 0.3–1.2)
Total Protein: 6 g/dL — ABNORMAL LOW (ref 6.5–8.1)
Total Protein: 6.2 g/dL — ABNORMAL LOW (ref 6.5–8.1)

## 2020-12-10 LAB — CBC
HCT: 39.2 % (ref 39.0–52.0)
HCT: 39.2 % (ref 39.0–52.0)
Hemoglobin: 13.1 g/dL (ref 13.0–17.0)
Hemoglobin: 13.7 g/dL (ref 13.0–17.0)
MCH: 34 pg (ref 26.0–34.0)
MCH: 33.5 pg (ref 26.0–34.0)
MCHC: 33.4 g/dL (ref 30.0–36.0)
MCHC: 34.9 g/dL (ref 30.0–36.0)
MCV: 101.8 fL — ABNORMAL HIGH (ref 80.0–100.0)
MCV: 95.8 fL (ref 80.0–100.0)
Platelets: 188 10*3/uL (ref 150–400)
Platelets: 172 10*3/uL (ref 150–400)
RBC: 3.85 MIL/uL — ABNORMAL LOW (ref 4.22–5.81)
RBC: 4.09 MIL/uL — ABNORMAL LOW (ref 4.22–5.81)
RDW: 13.1 % (ref 11.5–15.5)
RDW: 15.9 % — ABNORMAL HIGH (ref 11.5–15.5)
WBC: 5.1 10*3/uL (ref 4.0–10.5)
WBC: 11.4 10*3/uL — ABNORMAL HIGH (ref 4.0–10.5)
nRBC: 0 % (ref 0.0–0.2)
nRBC: 0 % (ref 0.0–0.2)

## 2020-12-10 LAB — URINALYSIS, ROUTINE W REFLEX MICROSCOPIC
Bilirubin Urine: NEGATIVE
Glucose, UA: NEGATIVE mg/dL
Hgb urine dipstick: NEGATIVE
Ketones, ur: NEGATIVE mg/dL
Leukocytes,Ua: NEGATIVE
Nitrite: NEGATIVE
Protein, ur: NEGATIVE mg/dL
Specific Gravity, Urine: 1.032 — ABNORMAL HIGH (ref 1.005–1.030)
pH: 5 (ref 5.0–8.0)

## 2020-12-10 LAB — RESP PANEL BY RT-PCR (FLU A&B, COVID) ARPGX2
Influenza A by PCR: NEGATIVE
Influenza B by PCR: NEGATIVE
SARS Coronavirus 2 by RT PCR: NEGATIVE

## 2020-12-10 LAB — I-STAT CHEM 8, ED
BUN: 14 mg/dL (ref 6–20)
Calcium, Ion: 1.03 mmol/L — ABNORMAL LOW (ref 1.15–1.40)
Chloride: 107 mmol/L (ref 98–111)
Creatinine, Ser: 1.5 mg/dL — ABNORMAL HIGH (ref 0.61–1.24)
Glucose, Bld: 112 mg/dL — ABNORMAL HIGH (ref 70–99)
HCT: 39 % (ref 39.0–52.0)
Hemoglobin: 13.3 g/dL (ref 13.0–17.0)
Potassium: 3.7 mmol/L (ref 3.5–5.1)
Sodium: 145 mmol/L (ref 135–145)
TCO2: 24 mmol/L (ref 22–32)

## 2020-12-10 LAB — RAPID URINE DRUG SCREEN, HOSP PERFORMED
Amphetamines: NOT DETECTED
Barbiturates: NOT DETECTED
Benzodiazepines: NOT DETECTED
Cocaine: NOT DETECTED
Opiates: NOT DETECTED
Tetrahydrocannabinol: NOT DETECTED

## 2020-12-10 LAB — PROTIME-INR
INR: 0.9 (ref 0.8–1.2)
INR: 1 (ref 0.8–1.2)
Prothrombin Time: 12.1 seconds (ref 11.4–15.2)
Prothrombin Time: 12.4 seconds (ref 11.4–15.2)

## 2020-12-10 LAB — LACTIC ACID, PLASMA
Lactic Acid, Venous: 1.8 mmol/L (ref 0.5–1.9)
Lactic Acid, Venous: 3 mmol/L (ref 0.5–1.9)

## 2020-12-10 LAB — ABO/RH: ABO/RH(D): B POS

## 2020-12-10 LAB — MAGNESIUM: Magnesium: 1.7 mg/dL (ref 1.7–2.4)

## 2020-12-10 LAB — TRAUMA TEG PANEL
CFF Max Amplitude: 19.3 mm (ref 15–32)
Citrated Kaolin (R): 5.6 min (ref 4.6–9.1)
Citrated Rapid TEG (MA): 59.1 mm (ref 52–70)
Lysis at 30 Minutes: 2.2 % (ref 0.0–2.6)

## 2020-12-10 LAB — ETHANOL
Alcohol, Ethyl (B): 426 mg/dL (ref <10)
Alcohol, Ethyl (B): 252 mg/dL — ABNORMAL HIGH (ref <10)

## 2020-12-10 LAB — PHOSPHORUS: Phosphorus: 3.7 mg/dL (ref 2.5–4.6)

## 2020-12-10 MED ORDER — TETANUS-DIPHTH-ACELL PERTUSSIS 5-2.5-18.5 LF-MCG/0.5 IM SUSY
0.5000 mL | PREFILLED_SYRINGE | Freq: Once | INTRAMUSCULAR | Status: AC
  Administered 2020-12-10: 0.5 mL via INTRAMUSCULAR

## 2020-12-10 MED ORDER — ONDANSETRON HCL 4 MG/2ML IJ SOLN: 4.0000 mg | Freq: Four times a day (QID) | INTRAMUSCULAR | Status: DC | PRN

## 2020-12-10 MED ORDER — QUETIAPINE FUMARATE 25 MG PO TABS
50.0000 mg | ORAL_TABLET | Freq: Every day | ORAL | Status: DC
  Administered 2020-12-10 – 2020-12-11 (×2): 50 mg via ORAL
  Filled 2020-12-10 (×2): qty 2
  Filled 2020-12-10: qty 1

## 2020-12-10 MED ORDER — MAGNESIUM SULFATE 2 GM/50ML IV SOLN
2.0000 g | Freq: Once | INTRAVENOUS | Status: AC
  Administered 2020-12-11: 2 g via INTRAVENOUS
  Filled 2020-12-10: qty 50

## 2020-12-10 MED ORDER — CEFAZOLIN SODIUM-DEXTROSE 2-4 GM/100ML-% IV SOLN
2.0000 g | Freq: Three times a day (TID) | INTRAVENOUS | Status: AC
  Administered 2020-12-10 – 2020-12-11 (×2): 2 g via INTRAVENOUS
  Filled 2020-12-10 (×3): qty 100

## 2020-12-10 MED ORDER — CEFAZOLIN SODIUM-DEXTROSE 1-4 GM/50ML-% IV SOLN: 1.0000 g | Freq: Once | INTRAVENOUS | Status: DC

## 2020-12-10 MED ORDER — LORAZEPAM 2 MG/ML IJ SOLN
1.0000 mg | INTRAMUSCULAR | Status: DC | PRN
  Administered 2020-12-11: 2 mg via INTRAVENOUS
  Filled 2020-12-10: qty 1

## 2020-12-10 MED ORDER — TOPIRAMATE 25 MG PO TABS
25.0000 mg | ORAL_TABLET | Freq: Every day | ORAL | Status: DC
  Administered 2020-12-10 – 2020-12-12 (×2): 25 mg via ORAL
  Filled 2020-12-10 (×2): qty 1

## 2020-12-10 MED ORDER — LORAZEPAM 1 MG PO TABS
1.0000 mg | ORAL_TABLET | ORAL | Status: DC | PRN
  Administered 2020-12-11: 1 mg via ORAL
  Filled 2020-12-10: qty 1

## 2020-12-10 MED ORDER — POVIDONE-IODINE 10 % EX SWAB
2.0000 "application " | Freq: Once | CUTANEOUS | Status: AC
  Administered 2020-12-11: 2 via TOPICAL

## 2020-12-10 MED ORDER — ONDANSETRON HCL 4 MG PO TABS: 4.0000 mg | ORAL_TABLET | Freq: Four times a day (QID) | ORAL | Status: DC | PRN

## 2020-12-10 MED ORDER — LIDOCAINE-EPINEPHRINE 1 %-1:100000 IJ SOLN
10.0000 mL | Freq: Once | INTRAMUSCULAR | Status: AC
  Administered 2020-12-10: 10 mL via INTRADERMAL
  Filled 2020-12-10: qty 1

## 2020-12-10 MED ORDER — CHLORHEXIDINE GLUCONATE 4 % EX LIQD: 60.0000 mL | Freq: Once | CUTANEOUS | Status: DC

## 2020-12-10 MED ORDER — THIAMINE HCL 100 MG/ML IJ SOLN
100.0000 mg | Freq: Every day | INTRAMUSCULAR | Status: DC
  Administered 2020-12-10: 100 mg via INTRAVENOUS
  Filled 2020-12-10: qty 2

## 2020-12-10 MED ORDER — CEFAZOLIN SODIUM-DEXTROSE 2-4 GM/100ML-% IV SOLN
2.0000 g | Freq: Once | INTRAVENOUS | Status: AC
  Administered 2020-12-10: 2 g via INTRAVENOUS
  Filled 2020-12-10: qty 100

## 2020-12-10 MED ORDER — ACETAMINOPHEN 325 MG PO TABS
650.0000 mg | ORAL_TABLET | Freq: Four times a day (QID) | ORAL | Status: DC | PRN
  Administered 2020-12-10: 650 mg via ORAL
  Filled 2020-12-10: qty 2

## 2020-12-10 MED ORDER — ENSURE PRE-SURGERY PO LIQD
296.0000 mL | Freq: Once | ORAL | Status: AC
  Administered 2020-12-11: 296 mL via ORAL
  Filled 2020-12-10: qty 296

## 2020-12-10 MED ORDER — SODIUM CHLORIDE 0.9 % IV BOLUS
2000.0000 mL | Freq: Once | INTRAVENOUS | Status: AC
  Administered 2020-12-10: 2000 mL via INTRAVENOUS

## 2020-12-10 MED ORDER — POTASSIUM CHLORIDE 10 MEQ/100ML IV SOLN
10.0000 meq | INTRAVENOUS | Status: AC
  Administered 2020-12-11 (×2): 10 meq via INTRAVENOUS
  Filled 2020-12-10 (×2): qty 100

## 2020-12-10 MED ORDER — HALOPERIDOL LACTATE 5 MG/ML IJ SOLN
5.0000 mg | Freq: Once | INTRAMUSCULAR | Status: AC
  Administered 2020-12-10: 5 mg via INTRAVENOUS

## 2020-12-10 MED ORDER — ACETAMINOPHEN 650 MG RE SUPP: 650.0000 mg | Freq: Four times a day (QID) | RECTAL | Status: DC | PRN

## 2020-12-10 MED ORDER — ADULT MULTIVITAMIN W/MINERALS CH
1.0000 | ORAL_TABLET | Freq: Every day | ORAL | Status: DC
  Administered 2020-12-10 – 2020-12-12 (×2): 1 via ORAL
  Filled 2020-12-10 (×2): qty 1

## 2020-12-10 MED ORDER — CEFAZOLIN SODIUM-DEXTROSE 2-4 GM/100ML-% IV SOLN
2.0000 g | INTRAVENOUS | Status: AC
  Administered 2020-12-11: 2 g via INTRAVENOUS
  Filled 2020-12-10: qty 100

## 2020-12-10 MED ORDER — LACTATED RINGERS IV SOLN: INTRAVENOUS | Status: DC

## 2020-12-10 MED ORDER — IOHEXOL 300 MG/ML  SOLN
100.0000 mL | Freq: Once | INTRAMUSCULAR | Status: AC | PRN
  Administered 2020-12-10: 100 mL via INTRAVENOUS

## 2020-12-10 MED ORDER — ENOXAPARIN SODIUM 40 MG/0.4ML ~~LOC~~ SOLN
40.0000 mg | SUBCUTANEOUS | Status: DC
  Administered 2020-12-10 – 2020-12-11 (×2): 40 mg via SUBCUTANEOUS
  Filled 2020-12-10 (×2): qty 0.4

## 2020-12-10 MED ORDER — FOLIC ACID 1 MG PO TABS
1.0000 mg | ORAL_TABLET | Freq: Every day | ORAL | Status: DC
  Administered 2020-12-10 – 2020-12-12 (×2): 1 mg via ORAL
  Filled 2020-12-10 (×2): qty 1

## 2020-12-10 MED ORDER — KETOROLAC TROMETHAMINE 30 MG/ML IJ SOLN
30.0000 mg | Freq: Four times a day (QID) | INTRAMUSCULAR | Status: DC | PRN
  Administered 2020-12-11 – 2020-12-12 (×2): 30 mg via INTRAVENOUS
  Filled 2020-12-10: qty 1

## 2020-12-10 MED ORDER — THIAMINE HCL 100 MG/ML IJ SOLN
INTRAVENOUS | Status: AC
  Filled 2020-12-10 (×4): qty 1000

## 2020-12-10 MED ORDER — THIAMINE HCL 100 MG PO TABS
100.0000 mg | ORAL_TABLET | Freq: Every day | ORAL | Status: DC
  Administered 2020-12-12: 100 mg via ORAL
  Filled 2020-12-10: qty 1

## 2020-12-10 MED ORDER — LIDOCAINE-EPINEPHRINE (PF) 2 %-1:200000 IJ SOLN
10.0000 mL | Freq: Once | INTRAMUSCULAR | Status: DC
  Filled 2020-12-10: qty 10
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
## 2020-12-10 NOTE — ED Notes (Signed)
RN attempted to give report twice.

## 2020-12-10 NOTE — ED Notes (Signed)
RN attempted to give report.

## 2020-12-10 NOTE — ED Notes (Signed)
Ancef complete
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
## 2020-12-10 NOTE — ED Notes (Signed)
Ancef 2g started
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
## 2020-12-11 ENCOUNTER — Inpatient Hospital Stay (HOSPITAL_COMMUNITY): Payer: No Typology Code available for payment source | Admitting: Certified Registered Nurse Anesthetist

## 2020-12-11 ENCOUNTER — Encounter (HOSPITAL_COMMUNITY)
Admission: EM | Disposition: A | Discharge status: Home / Self Care | Payer: Self-pay | Source: Home / Self Care | Attending: Internal Medicine

## 2020-12-11 DIAGNOSIS — F10230 Alcohol dependence with withdrawal, uncomplicated: Secondary | ICD-10-CM | POA: Diagnosis not present

## 2020-12-11 HISTORY — PX: I & D EXTREMITY: SHX5045

## 2020-12-11 LAB — BLOOD PRODUCT ORDER (VERBAL) VERIFICATION

## 2020-12-11 LAB — HIV ANTIBODY (ROUTINE TESTING W REFLEX): HIV Screen 4th Generation wRfx: NONREACTIVE

## 2020-12-11 LAB — BPAM RBC
Blood Product Expiration Date: 202201212359
Blood Product Expiration Date: 202202012359
ISSUE DATE / TIME: 202201121435
ISSUE DATE / TIME: 202201121444
Unit Type and Rh: 5100
Unit Type and Rh: 5100

## 2020-12-11 LAB — TYPE AND SCREEN
ABO/RH(D): B POS
Antibody Screen: NEGATIVE
Unit division: 0
Unit division: 0

## 2020-12-11 LAB — SURGICAL PCR SCREEN
MRSA, PCR: NEGATIVE
Staphylococcus aureus: NEGATIVE

## 2020-12-11 LAB — BASIC METABOLIC PANEL
Anion gap: 11 (ref 5–15)
BUN: 9 mg/dL (ref 6–20)
CO2: 22 mmol/L (ref 22–32)
Calcium: 8.4 mg/dL — ABNORMAL LOW (ref 8.9–10.3)
Chloride: 105 mmol/L (ref 98–111)
Creatinine, Ser: 0.83 mg/dL (ref 0.61–1.24)
GFR, Estimated: >60 mL/min (ref >60)
Glucose, Bld: 147 mg/dL — ABNORMAL HIGH (ref 70–99)
Potassium: 3.7 mmol/L (ref 3.5–5.1)
Sodium: 138 mmol/L (ref 135–145)

## 2020-12-11 LAB — LACTIC ACID, PLASMA
Lactic Acid, Venous: 2.9 mmol/L (ref 0.5–1.9)
Lactic Acid, Venous: 2.6 mmol/L (ref 0.5–1.9)

## 2020-12-11 LAB — CBC
HCT: 34.7 % — ABNORMAL LOW (ref 39.0–52.0)
Hemoglobin: 12.3 g/dL — ABNORMAL LOW (ref 13.0–17.0)
MCH: 34.1 pg — ABNORMAL HIGH (ref 26.0–34.0)
MCHC: 35.4 g/dL (ref 30.0–36.0)
MCV: 96.1 fL (ref 80.0–100.0)
Platelets: 135 10*3/uL — ABNORMAL LOW (ref 150–400)
RBC: 3.61 MIL/uL — ABNORMAL LOW (ref 4.22–5.81)
RDW: 15.9 % — ABNORMAL HIGH (ref 11.5–15.5)
WBC: 8.4 10*3/uL (ref 4.0–10.5)
nRBC: 0 % (ref 0.0–0.2)

## 2020-12-11 SURGERY — IRRIGATION AND DEBRIDEMENT EXTREMITY
Anesthesia: General | Laterality: Left

## 2020-12-11 MED ORDER — ARTIFICIAL TEARS OPHTHALMIC OINT
TOPICAL_OINTMENT | OPHTHALMIC | Status: AC
  Filled 2020-12-11: qty 3.5

## 2020-12-11 MED ORDER — ACETAMINOPHEN 10 MG/ML IV SOLN
INTRAVENOUS | Status: DC | PRN
  Administered 2020-12-11: 1000 mg via INTRAVENOUS

## 2020-12-11 MED ORDER — CEFAZOLIN SODIUM-DEXTROSE 1-4 GM/50ML-% IV SOLN
1.0000 g | Freq: Three times a day (TID) | INTRAVENOUS | Status: AC
  Administered 2020-12-11 – 2020-12-12 (×3): 1 g via INTRAVENOUS
  Filled 2020-12-11 (×2): qty 50

## 2020-12-11 MED ORDER — EPHEDRINE SULFATE-NACL 50-0.9 MG/10ML-% IV SOSY
PREFILLED_SYRINGE | INTRAVENOUS | Status: DC | PRN
  Administered 2020-12-11 (×2): 5 mg via INTRAVENOUS

## 2020-12-11 MED ORDER — DEXAMETHASONE SODIUM PHOSPHATE 10 MG/ML IJ SOLN
INTRAMUSCULAR | Status: AC
  Filled 2020-12-11: qty 1

## 2020-12-11 MED ORDER — PHENYLEPHRINE 40 MCG/ML (10ML) SYRINGE FOR IV PUSH (FOR BLOOD PRESSURE SUPPORT)
PREFILLED_SYRINGE | INTRAVENOUS | Status: DC | PRN
  Administered 2020-12-11 (×5): 80 ug via INTRAVENOUS

## 2020-12-11 MED ORDER — PROPOFOL 10 MG/ML IV BOLUS
INTRAVENOUS | Status: DC | PRN
  Administered 2020-12-11: 200 mg via INTRAVENOUS

## 2020-12-11 MED ORDER — PROPOFOL 1000 MG/100ML IV EMUL
INTRAVENOUS | Status: AC
  Filled 2020-12-11: qty 400

## 2020-12-11 MED ORDER — FENTANYL CITRATE (PF) 250 MCG/5ML IJ SOLN
INTRAMUSCULAR | Status: AC
  Filled 2020-12-11: qty 5

## 2020-12-11 MED ORDER — LIDOCAINE 2% (20 MG/ML) 5 ML SYRINGE
INTRAMUSCULAR | Status: AC
  Filled 2020-12-11: qty 5

## 2020-12-11 MED ORDER — PROPOFOL 10 MG/ML IV BOLUS
INTRAVENOUS | Status: AC
  Filled 2020-12-11: qty 20

## 2020-12-11 MED ORDER — ONDANSETRON HCL 4 MG/2ML IJ SOLN
INTRAMUSCULAR | Status: DC | PRN
  Administered 2020-12-11: 4 mg via INTRAVENOUS

## 2020-12-11 MED ORDER — MIDAZOLAM HCL 5 MG/5ML IJ SOLN
INTRAMUSCULAR | Status: DC | PRN
  Administered 2020-12-11: 2 mg via INTRAVENOUS

## 2020-12-11 MED ORDER — HYDROMORPHONE HCL 1 MG/ML IJ SOLN: 0.2500 mg | INTRAMUSCULAR | Status: DC | PRN

## 2020-12-11 MED ORDER — LIDOCAINE 2% (20 MG/ML) 5 ML SYRINGE
INTRAMUSCULAR | Status: DC | PRN
  Administered 2020-12-11: 60 mg via INTRAVENOUS

## 2020-12-11 MED ORDER — ACETAMINOPHEN 10 MG/ML IV SOLN
INTRAVENOUS | Status: AC
  Filled 2020-12-11: qty 100

## 2020-12-11 MED ORDER — MIDAZOLAM HCL 2 MG/2ML IJ SOLN
INTRAMUSCULAR | Status: AC
  Filled 2020-12-11: qty 2

## 2020-12-11 MED ORDER — ONDANSETRON HCL 4 MG/2ML IJ SOLN
INTRAMUSCULAR | Status: AC
  Filled 2020-12-11: qty 2

## 2020-12-11 MED ORDER — SODIUM CHLORIDE 0.9 % IR SOLN
Status: DC | PRN
  Administered 2020-12-11 (×2): 3000 mL

## 2020-12-11 MED ORDER — LACTATED RINGERS IV SOLN: INTRAVENOUS | Status: DC

## 2020-12-11 MED ORDER — CHLORHEXIDINE GLUCONATE 0.12 % MT SOLN
15.0000 mL | Freq: Once | OROMUCOSAL | Status: AC
  Administered 2020-12-11: 15 mL via OROMUCOSAL
  Filled 2020-12-11: qty 15

## 2020-12-11 MED ORDER — ORAL CARE MOUTH RINSE: 15.0000 mL | Freq: Once | OROMUCOSAL | Status: AC

## 2020-12-11 MED ORDER — FENTANYL CITRATE (PF) 100 MCG/2ML IJ SOLN
INTRAMUSCULAR | Status: DC | PRN
  Administered 2020-12-11 (×3): 50 ug via INTRAVENOUS

## 2020-12-11 MED ORDER — DEXAMETHASONE SODIUM PHOSPHATE 10 MG/ML IJ SOLN
INTRAMUSCULAR | Status: DC | PRN
  Administered 2020-12-11: 4 mg via INTRAVENOUS

## 2020-12-11 MED ORDER — 0.9 % SODIUM CHLORIDE (POUR BTL) OPTIME
TOPICAL | Status: DC | PRN
  Administered 2020-12-11: 1000 mL

## 2020-12-11 SURGICAL SUPPLY — 48 items
BNDG COHESIVE 4X5 TAN STRL (GAUZE/BANDAGES/DRESSINGS) ×2 IMPLANT
BNDG ELASTIC 4X5.8 VLCR STR LF (GAUZE/BANDAGES/DRESSINGS) ×2 IMPLANT
BNDG ELASTIC 6X5.8 VLCR STR LF (GAUZE/BANDAGES/DRESSINGS) ×1 IMPLANT
BNDG GAUZE ELAST 4 BULKY (GAUZE/BANDAGES/DRESSINGS) ×4 IMPLANT
BRUSH SCRUB EZ PLAIN DRY (MISCELLANEOUS) ×4 IMPLANT
COVER SURGICAL LIGHT HANDLE (MISCELLANEOUS) ×4 IMPLANT
COVER WAND RF STERILE (DRAPES) IMPLANT
DRAPE U-SHAPE 47X51 STRL (DRAPES) ×2 IMPLANT
DRSG ADAPTIC 3X8 NADH LF (GAUZE/BANDAGES/DRESSINGS) ×2 IMPLANT
DRSG MEPITEL 4X7.2 (GAUZE/BANDAGES/DRESSINGS) ×1 IMPLANT
ELECT REM PT RETURN 9FT ADLT (ELECTROSURGICAL)
ELECTRODE REM PT RTRN 9FT ADLT (ELECTROSURGICAL) IMPLANT
GAUZE SPONGE 4X4 12PLY STRL (GAUZE/BANDAGES/DRESSINGS) ×2 IMPLANT
GLOVE BIO SURGEON STRL SZ7.5 (GLOVE) ×2 IMPLANT
GLOVE BIO SURGEON STRL SZ8 (GLOVE) ×2 IMPLANT
GLOVE BIOGEL PI IND STRL 7.5 (GLOVE) ×1 IMPLANT
GLOVE BIOGEL PI IND STRL 8 (GLOVE) ×1 IMPLANT
GLOVE BIOGEL PI IND STRL 9 (GLOVE) ×1 IMPLANT
GLOVE BIOGEL PI INDICATOR 7.5 (GLOVE) ×1
GLOVE BIOGEL PI INDICATOR 8 (GLOVE) ×1
GLOVE BIOGEL PI INDICATOR 9 (GLOVE) ×1
GOWN STRL REUS W/ TWL LRG LVL3 (GOWN DISPOSABLE) ×2 IMPLANT
GOWN STRL REUS W/ TWL XL LVL3 (GOWN DISPOSABLE) ×1 IMPLANT
GOWN STRL REUS W/TWL LRG LVL3 (GOWN DISPOSABLE) ×4
GOWN STRL REUS W/TWL XL LVL3 (GOWN DISPOSABLE) ×2
HANDPIECE INTERPULSE COAX TIP (DISPOSABLE)
KIT BASIN OR (CUSTOM PROCEDURE TRAY) ×2 IMPLANT
KIT TURNOVER KIT B (KITS) ×2 IMPLANT
MANIFOLD NEPTUNE II (INSTRUMENTS) ×2 IMPLANT
NS IRRIG 1000ML POUR BTL (IV SOLUTION) ×2 IMPLANT
PACK ORTHO EXTREMITY (CUSTOM PROCEDURE TRAY) ×2 IMPLANT
PAD ARMBOARD 7.5X6 YLW CONV (MISCELLANEOUS) ×4 IMPLANT
PAD CAST 4YDX4 CTTN HI CHSV (CAST SUPPLIES) IMPLANT
PADDING CAST COTTON 4X4 STRL (CAST SUPPLIES) ×2
PADDING CAST COTTON 6X4 STRL (CAST SUPPLIES) ×2 IMPLANT
SET CYSTO W/LG BORE CLAMP LF (SET/KITS/TRAYS/PACK) ×1 IMPLANT
SET HNDPC FAN SPRY TIP SCT (DISPOSABLE) IMPLANT
SOL PREP POV-IOD 4OZ 10% (MISCELLANEOUS) ×2 IMPLANT
SOL PREP PROV IODINE SCRUB 4OZ (MISCELLANEOUS) ×2 IMPLANT
SPONGE LAP 18X18 RF (DISPOSABLE) ×2 IMPLANT
STOCKINETTE IMPERVIOUS 9X36 MD (GAUZE/BANDAGES/DRESSINGS) IMPLANT
SUT PDS AB 2-0 CT1 27 (SUTURE) IMPLANT
TOWEL GREEN STERILE (TOWEL DISPOSABLE) ×4 IMPLANT
TOWEL GREEN STERILE FF (TOWEL DISPOSABLE) ×2 IMPLANT
TUBE CONNECTING 12X1/4 (SUCTIONS) ×2 IMPLANT
UNDERPAD 30X36 HEAVY ABSORB (UNDERPADS AND DIAPERS) ×2 IMPLANT
WATER STERILE IRR 1000ML POUR (IV SOLUTION) ×2 IMPLANT
YANKAUER SUCT BULB TIP NO VENT (SUCTIONS) ×2 IMPLANT

## 2020-12-11 NOTE — Transfer of Care (Signed)
Immediate Anesthesia Transfer of Care Note  Patient: Sean Fields  Procedure(s) Performed: IRRIGATION AND DEBRIDEMENT EXTREMITY (Left )  Patient Location: PACU  Anesthesia Type:General  Level of Consciousness: drowsy and patient cooperative  Airway & Oxygen Therapy: Patient Spontanous Breathing and Patient connected to face mask oxygen  Post-op Assessment: Report given to RN, Post -op Vital signs reviewed and stable and Patient moving all extremities  Post vital signs: Reviewed and stable  Last Vitals:  Vitals Value Taken Time  BP 136/109 12/11/20 1131  Temp    Pulse 86 12/11/20 1131  Resp 11 12/11/20 1131  SpO2 96 % 12/11/20 1131  Vitals shown include unvalidated device data.  Last Pain:  Vitals:   12/11/20 0940  TempSrc:   PainSc: 3          Complications: No complications documented.
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
## 2020-12-11 NOTE — Anesthesia Procedure Notes (Signed)
Procedure Name: LMA Insertion Date/Time: 12/11/2020 10:12 AM Performed by: Demetrio Lapping, CRNA Pre-anesthesia Checklist: Patient identified, Emergency Drugs available, Suction available and Patient being monitored Patient Re-evaluated:Patient Re-evaluated prior to induction Oxygen Delivery Method: Circle System Utilized Preoxygenation: Pre-oxygenation with 100% oxygen Induction Type: IV induction Ventilation: Mask ventilation without difficulty LMA: LMA with gastric port inserted LMA Size: 5.0 Number of attempts: 1 Airway Equipment and Method: Bite block Placement Confirmation: positive ETCO2 Tube secured with: Tape Dental Injury: Teeth and Oropharynx as per pre-operative assessment

## 2020-12-11 NOTE — Anesthesia Preprocedure Evaluation (Addendum)
Anesthesia Evaluation  Patient identified by MRN, date of birth, ID band Patient awake    Reviewed: Allergy & Precautions, H&P , NPO status , Patient's Chart, lab work & pertinent test results  Airway Mallampati: III  TM Distance: >3 FB Neck ROM: Full    Dental no notable dental hx. (+) Teeth Intact, Dental Advisory Given   Pulmonary neg pulmonary ROS, Patient abstained from smoking.,    Pulmonary exam normal breath sounds clear to auscultation       Cardiovascular negative cardio ROS   Rhythm:Regular Rate:Normal     Neuro/Psych Anxiety negative neurological ROS     GI/Hepatic negative GI ROS, Neg liver ROS,   Endo/Other  negative endocrine ROS  Renal/GU negative Renal ROS  negative genitourinary   Musculoskeletal   Abdominal   Peds  Hematology negative hematology ROS (+)   Anesthesia Other Findings   Reproductive/Obstetrics negative OB ROS                            Anesthesia Physical Anesthesia Plan  ASA: II  Anesthesia Plan: General   Post-op Pain Management:    Induction: Intravenous  PONV Risk Score and Plan: 3 and Ondansetron, Dexamethasone and Midazolam  Airway Management Planned: LMA and Oral ETT  Additional Equipment:   Intra-op Plan:   Post-operative Plan: Extubation in OR  Informed Consent: I have reviewed the patients History and Physical, chart, labs and discussed the procedure including the risks, benefits and alternatives for the proposed anesthesia with the patient or authorized representative who has indicated his/her understanding and acceptance.     Dental advisory given  Plan Discussed with: CRNA  Anesthesia Plan Comments:         Anesthesia Quick Evaluation

## 2020-12-11 NOTE — Anesthesia Postprocedure Evaluation (Signed)
Anesthesia Post Note  Patient: Sean Fields  Procedure(s) Performed: IRRIGATION AND DEBRIDEMENT EXTREMITY (Left )     Patient location during evaluation: PACU Anesthesia Type: General Level of consciousness: awake and alert Pain management: pain level controlled Vital Signs Assessment: post-procedure vital signs reviewed and stable Respiratory status: spontaneous breathing, nonlabored ventilation and respiratory function stable Cardiovascular status: blood pressure returned to baseline and stable Postop Assessment: no apparent nausea or vomiting Anesthetic complications: no   No complications documented.  Last Vitals:  Vitals:   12/11/20 1245 12/11/20 1303  BP: 117/80 111/78  Pulse: 70 68  Resp: 12 14  Temp:  36.8 C  SpO2: 98% 98%    Last Pain:  Vitals:   12/11/20 1303  TempSrc: Oral  PainSc: 0-No pain                 Jaxan Michel,W. EDMOND

## 2020-12-11 NOTE — Progress Notes (Signed)
   12/11/20 0339  Vitals  Temp 98.5 F (36.9 C)  Temp Source Oral  BP 96/62  MAP (mmHg) 72  BP Location Left Arm  BP Method Automatic  Patient Position (if appropriate) Lying  Pulse Rate (!) 107  Pulse Rate Source Monitor  Level of Consciousness  Level of Consciousness Alert  MEWS COLOR  MEWS Score Color Yellow  Oxygen Therapy  SpO2 95 %  O2 Device Room Air  MEWS Score  MEWS Temp 0  MEWS Systolic 1  MEWS Pulse 1  MEWS RR 0  MEWS LOC 0  MEWS Score 2

## 2020-12-11 NOTE — Progress Notes (Signed)
   12/11/20 0405  Assess: if the MEWS score is Yellow or Red  Were vital signs taken at a resting state? Yes  Focused Assessment No change from prior assessment  Early Detection of Sepsis Score *See Row Information* Low  MEWS guidelines implemented *See Row Information* Yes  Treat  MEWS Interventions Administered scheduled meds/treatments  Pain Scale 0-10  Pain Score 0  Take Vital Signs  Increase Vital Sign Frequency  Yellow: Q 2hr X 2 then Q 4hr X 2, if remains yellow, continue Q 4hrs  Escalate  MEWS: Escalate Yellow: discuss with charge nurse/RN and consider discussing with provider and RRT  Notify: Charge Nurse/RN  Name of Charge Nurse/RN Notified Alona Bene, RN  Date Charge Nurse/RN Notified 12/11/20  Time Charge Nurse/RN Notified 0405  Patient is alert, heart rate elevated to 107, BP at previous baseline. Patient on telemetry. Ordered LR initiated, other bag of IV fluids still infusing. Trending Lactate. No acute change from prior assessment.

## 2020-12-11 NOTE — Progress Notes (Addendum)
0920 Pt is A&O x4, NPO maint. To short stay via bed. Report was given to [facility].  1300 Received pt from PACU, very sleepy. Left lower leg with ace wrap dry and intact. Denies pain at this time.

## 2020-12-11 NOTE — Plan of Care (Signed)
Patient received from the ED, CIWA score of 3, states does not drink alcohol daily. His lactic acid up from 1.8 to 3.0. Provider on call ordered IV fluids and electrolyte replacement. He had pain to left foot, tylenol administered. Dressing to left foot changed, small amount of blood noted. Consent for procedure in am obtained, NPO after midnight. Problem: Education: Goal: Knowledge of General Education information will improve Description: Including pain rating scale, medication(s)/side effects and non-pharmacologic comfort measures Outcome: Progressing   Problem: Health Behavior/Discharge Planning: Goal: Ability to manage health-related needs will improve Outcome: Progressing   Problem: Clinical Measurements: Goal: Ability to maintain clinical measurements within normal limits will improve Outcome: Progressing Goal: Will remain free from infection Outcome: Progressing Goal: Diagnostic test results will improve Outcome: Progressing Goal: Respiratory complications will improve Outcome: Progressing Goal: Cardiovascular complication will be avoided Outcome: Progressing   Problem: Activity: Goal: Risk for activity intolerance will decrease Outcome: Progressing   Problem: Nutrition: Goal: Adequate nutrition will be maintained Outcome: Progressing   Problem: Coping: Goal: Level of anxiety will decrease Outcome: Progressing   Problem: Elimination: Goal: Will not experience complications related to bowel motility Outcome: Progressing Goal: Will not experience complications related to urinary retention Outcome: Progressing   Problem: Pain Managment: Goal: General experience of comfort will improve Outcome: Progressing   Problem: Safety: Goal: Ability to remain free from injury will improve Outcome: Progressing   Problem: Skin Integrity: Goal: Risk for impaired skin integrity will decrease Outcome: Progressing

## 2020-12-12 ENCOUNTER — Encounter (HOSPITAL_COMMUNITY): Payer: Self-pay | Admitting: Orthopedic Surgery

## 2020-12-12 ENCOUNTER — Other Ambulatory Visit (HOSPITAL_COMMUNITY): Payer: Self-pay | Admitting: Orthopedic Surgery

## 2020-12-12 DIAGNOSIS — F10230 Alcohol dependence with withdrawal, uncomplicated: Secondary | ICD-10-CM | POA: Diagnosis not present

## 2020-12-12 LAB — CBC
HCT: 32.8 % — ABNORMAL LOW (ref 39.0–52.0)
Hemoglobin: 11.8 g/dL — ABNORMAL LOW (ref 13.0–17.0)
MCH: 33.9 pg (ref 26.0–34.0)
MCHC: 36 g/dL (ref 30.0–36.0)
MCV: 94.3 fL (ref 80.0–100.0)
Platelets: 146 10*3/uL — ABNORMAL LOW (ref 150–400)
RBC: 3.48 MIL/uL — ABNORMAL LOW (ref 4.22–5.81)
RDW: 14.5 % (ref 11.5–15.5)
WBC: 9.5 10*3/uL (ref 4.0–10.5)
nRBC: 0 % (ref 0.0–0.2)

## 2020-12-12 LAB — COMPREHENSIVE METABOLIC PANEL
ALT: 15 U/L (ref 0–44)
AST: 24 U/L (ref 15–41)
Albumin: 2.9 g/dL — ABNORMAL LOW (ref 3.5–5.0)
Alkaline Phosphatase: 48 U/L (ref 38–126)
Anion gap: 9 (ref 5–15)
BUN: 6 mg/dL (ref 6–20)
CO2: 25 mmol/L (ref 22–32)
Calcium: 8.4 mg/dL — ABNORMAL LOW (ref 8.9–10.3)
Chloride: 103 mmol/L (ref 98–111)
Creatinine, Ser: 0.69 mg/dL (ref 0.61–1.24)
GFR, Estimated: >60 mL/min (ref >60)
Glucose, Bld: 98 mg/dL (ref 70–99)
Potassium: 3.6 mmol/L (ref 3.5–5.1)
Sodium: 137 mmol/L (ref 135–145)
Total Bilirubin: 1 mg/dL (ref 0.3–1.2)
Total Protein: 5.3 g/dL — ABNORMAL LOW (ref 6.5–8.1)

## 2020-12-12 LAB — MAGNESIUM: Magnesium: 1.7 mg/dL (ref 1.7–2.4)

## 2020-12-12 MED ORDER — PANTOPRAZOLE SODIUM 40 MG PO TBEC
40.0000 mg | DELAYED_RELEASE_TABLET | Freq: Every day | ORAL | Status: DC
  Administered 2020-12-12: 40 mg via ORAL
  Filled 2020-12-12: qty 1

## 2020-12-12 MED ORDER — HYDROXYZINE HCL 25 MG PO TABS: 25.0000 mg | ORAL_TABLET | Freq: Two times a day (BID) | ORAL | Status: DC | PRN

## 2020-12-12 MED ORDER — QUETIAPINE FUMARATE 25 MG PO TABS
50.0000 mg | ORAL_TABLET | Freq: Every day | ORAL | Status: DC
Start: 2020-12-12 — End: 2020-12-13

## 2020-12-12 MED ORDER — CEFADROXIL 500 MG PO CAPS: 500.0000 mg | ORAL_CAPSULE | Freq: Two times a day (BID) | ORAL | 0 refills | Status: DC

## 2020-12-12 MED ORDER — CEPHALEXIN 500 MG PO CAPS: 500.0000 mg | ORAL_CAPSULE | Freq: Two times a day (BID) | ORAL | 0 refills | Status: DC

## 2020-12-12 MED ORDER — VITAMIN D 25 MCG (1000 UNIT) PO TABS
1000.0000 [IU] | ORAL_TABLET | Freq: Every day | ORAL | Status: DC
  Administered 2020-12-12: 1000 [IU] via ORAL
  Filled 2020-12-12: qty 1

## 2020-12-12 MED FILL — CEPHALEXIN 500 MG CAPS: 500 | 7 days supply | Qty: 14 | Fill #0

## 2020-12-12 NOTE — TOC CAGE-AID Note (Signed)
Transition of Care [facility]) - CAGE-AID Screening   Patient Details  Name: Sean Fields MRN: [id number] Date of Birth: [birth year]  Transition of Care [facility]): Ripley Fraise D, RN,TRN Phone Number: 12/12/2020, 9:50 AM  CAGE-AID Screening:    Have You Ever Felt You Ought to Cut Down on Your Drinking or Drug Use?: Yes Have People Annoyed You By Critizing Your Drinking Or Drug Use?: Yes Have You Felt Bad Or Guilty About Your Drinking Or Drug Use?: Yes Have You Ever Had a Drink or Used Drugs First Thing In The Morning to Steady Your Nerves or to Get Rid of a Hangover?: No CAGE-AID Score: 3  Substance Abuse Education Offered: Yes  Substance abuse interventions: Other (must comment) (Patient denied any education at this time)

## 2020-12-12 NOTE — Discharge Instructions (Addendum)
Orthopaedic Discharge instructions  Nonweightbearing left leg Do not remove splint, we will remove at follow up  No dressing changes need to be done  Do not get splint wet

## 2020-12-12 NOTE — Discharge Summary (Signed)
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 last 72 hours. Thyroid function studies No results for input(s): TSH, T4TOTAL, T3FREE, THYROIDAB in the last 72 hours.  Invalid input(s): FREET3 Anemia work up No results for input(s): VITAMINB12, FOLATE, FERRITIN, TIBC, IRON, RETICCTPCT in the last 72 hours. Urinalysis    Component Value Date/Time   COLORURINE YELLOW 12/10/2020 1936   APPEARANCEUR CLEAR 12/10/2020 1936   LABSPEC 1.032 (H) 12/10/2020 1936   PHURINE 5.0 12/10/2020 1936   GLUCOSEU NEGATIVE 12/10/2020 1936   HGBUR NEGATIVE 12/10/2020 1936   BILIRUBINUR NEGATIVE 12/10/2020 1936   KETONESUR NEGATIVE 12/10/2020 1936   PROTEINUR NEGATIVE 12/10/2020 1936   NITRITE NEGATIVE 12/10/2020 1936   LEUKOCYTESUR NEGATIVE 12/10/2020 1936   Sepsis Labs Invalid input(s): PROCALCITONIN,  WBC,  LACTICIDVEN Microbiology Recent Results (from the past 240 hour(s))  Resp Panel by  RT-PCR (Flu A&B, Covid) Nasopharyngeal Swab     Status: None   Collection Time: 12/10/20  3:47 PM   Specimen: Nasopharyngeal Swab; Nasopharyngeal(NP) swabs in vial transport medium  Result Value Ref Range Status   SARS Coronavirus 2 by RT PCR NEGATIVE NEGATIVE Final    Comment: (NOTE) SARS-CoV-2 target nucleic acids are NOT DETECTED.  The SARS-CoV-2 RNA is generally detectable in upper respiratory specimens during the acute phase of infection. The lowest concentration of SARS-CoV-2 viral copies this assay can detect is 138 copies/mL. A negative result does not preclude SARS-Cov-2 infection and should not be used as the sole basis for treatment or other patient management decisions. A negative result may occur with  improper specimen collection/handling, submission of specimen other than nasopharyngeal swab, presence of viral mutation(s) within the areas targeted by this assay, and inadequate number of viral copies(<138 copies/mL). A negative result must be combined with clinical observations, patient history, and epidemiological information. The expected result is Negative.  Fact Sheet for Patients:  BloggerCourse.com  Fact Sheet for Healthcare Providers:  SeriousBroker.it  This test is no t yet approved or cleared by the [place] FDA and  has been authorized for detection and/or diagnosis of SARS-CoV-2 by FDA under an Emergency Use Authorization (  EUA). This EUA will remain  in effect (meaning this test can be used) for the duration of the COVID-19 declaration under Section 564(b)(1) of the Act, 21 U.S.C.section 360bbb-3(b)(1), unless the authorization is terminated  or revoked sooner.       Influenza A by PCR NEGATIVE NEGATIVE Final   Influenza B by PCR NEGATIVE NEGATIVE Final    Comment: (NOTE) The Xpert Xpress SARS-CoV-2/FLU/RSV plus assay is intended as an aid in the diagnosis of influenza from Nasopharyngeal swab  specimens and should not be used as a sole basis for treatment. Nasal washings and aspirates are unacceptable for Xpert Xpress SARS-CoV-2/FLU/RSV testing.  Fact Sheet for Patients: BloggerCourse.comhttps://www.fda.gov/media/152166/download  Fact Sheet for Healthcare Providers: SeriousBroker.ithttps://www.fda.gov/media/152162/download  This test is not yet approved or cleared by the [place] FDA and has been authorized for detection and/or diagnosis of SARS-CoV-2 by FDA under an Emergency Use Authorization (EUA). This EUA will remain in effect (meaning this test can be used) for the duration of the COVID-19 declaration under Section 564(b)(1) of the Act, 21 U.S.C. section 360bbb-3(b)(1), unless the authorization is terminated or revoked.  Performed at [facility], [address] [place], [place] [address]   Surgical pcr screen     Status: None   Collection Time: 12/10/20 11:19 PM   Specimen: Nasal Mucosa; Nasal Swab  Result Value Ref Range Status   MRSA, PCR NEGATIVE NEGATIVE Final   Staphylococcus aureus NEGATIVE NEGATIVE Final    Comment: (NOTE) The Xpert SA Assay (FDA approved for NASAL specimens in patients 40 years of age and older), is one component of a comprehensive surveillance program. It is not intended to diagnose infection nor to guide or monitor treatment. Performed at [facility], [address] [place], [place] [address]      Time coordinating discharge: 25 minutes  SIGNED: Lanae Boastamesh Syon Tews, MD  [facility] 12/12/2020, 2:31 PM  If 7PM-7AM, please contact night-coverage [email or web address]

## 2020-12-12 NOTE — Progress Notes (Signed)
Orthopedic Tech Progress Note Patient Details:  Sean Fields 12/25/1980 [id number]  Ortho Devices Type of Ortho Device: CAM walker Ortho Device/Splint Location: Patient Left Ortho Device/Splint Interventions: Ordered,Application,Adjustment   Post Interventions Patient Tolerated: Well Instructions Provided: Adjustment of device,Care of device   Zebedee Iba 12/12/2020, 5:23 PM

## 2020-12-12 NOTE — Progress Notes (Signed)
Discharge instructions addressed; Pt in stable condition; Pt.'s friend picked him up at the [employer] entrance.

## 2020-12-12 NOTE — Progress Notes (Signed)
Patient ID: Sean Fields, male   DOB: [birth year], 40 y.o.   MRN: [id number]   LOS: 2 days   Subjective: Foot feeling better than pre-op. Ready to go home.   Objective: Vital signs in last 24 hours: Temp:  [97.1 F (36.2 C)-98.5 F (36.9 C)] 98.2 F (36.8 C) (01/14 0750) Pulse Rate:  [68-91] 78 (01/14 0750) Resp:  [12-18] 16 (01/14 0750) BP: (100-136)/(64-109) 126/64 (01/14 0750) SpO2:  [95 %-98 %] 98 % (01/14 0750) Last BM Date: 12/10/20   Laboratory  CBC Recent Labs    12/11/20 0218 12/12/20 0806  WBC 8.4 9.5  HGB 12.3* 11.8*  HCT 34.7* 32.8*  PLT 135* 146*   BMET Recent Labs    12/11/20 0218 12/12/20 0806  NA 138 137  K 3.7 3.6  CL 105 103  CO2 22 25  GLUCOSE 147* 98  BUN 9 6  CREATININE 0.83 0.69  CALCIUM 8.4* 8.4*     Physical Exam General appearance: alert and no distress  Left foot lac s/p repair -- Splint in place, cap refill <2s, able to wiggle toes   Assessment/Plan: Left foot lac -- Ok for discharge from ortho standpoint. Please discharge on Duricef for 7d. F/u with Dr. Carola Frost in 7-10d.    Freeman Caldron, PA-C Orthopedic Surgery [phone] 12/12/2020
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
## 2020-12-15 ENCOUNTER — Encounter (INDEPENDENT_AMBULATORY_CARE_PROVIDER_SITE_OTHER): Payer: Self-pay | Admitting: Physician Assistant
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
## 2021-02-01 ENCOUNTER — Emergency Department (HOSPITAL_COMMUNITY)
Admission: EM | Admit: 2021-02-01 | Discharge: 2021-02-01 | Disposition: A | Discharge status: Home / Self Care | Payer: No Typology Code available for payment source | Attending: Emergency Medicine | Admitting: Emergency Medicine

## 2021-02-01 ENCOUNTER — Encounter (HOSPITAL_COMMUNITY): Payer: Self-pay

## 2021-02-01 ENCOUNTER — Other Ambulatory Visit: Payer: Self-pay

## 2021-02-01 DIAGNOSIS — Y907 Blood alcohol level of 200-239 mg/100 ml: Secondary | ICD-10-CM | POA: Insufficient documentation

## 2021-02-01 DIAGNOSIS — F17210 Nicotine dependence, cigarettes, uncomplicated: Secondary | ICD-10-CM | POA: Insufficient documentation

## 2021-02-01 DIAGNOSIS — F1010 Alcohol abuse, uncomplicated: Secondary | ICD-10-CM | POA: Diagnosis not present

## 2021-02-01 HISTORY — DX: Dermatitis, unspecified: L30.9

## 2021-02-01 LAB — CBC WITH DIFFERENTIAL/PLATELET
Abs Immature Granulocytes: 0.01 10*3/uL (ref 0.00–0.07)
Basophils Absolute: 0 10*3/uL (ref 0.0–0.1)
Basophils Relative: 1 %
Eosinophils Absolute: 0.1 10*3/uL (ref 0.0–0.5)
Eosinophils Relative: 1 %
HCT: 44.5 % (ref 39.0–52.0)
Hemoglobin: 15.1 g/dL (ref 13.0–17.0)
Immature Granulocytes: 0 %
Lymphocytes Relative: 26 %
Lymphs Abs: 1 10*3/uL (ref 0.7–4.0)
MCH: 32.5 pg (ref 26.0–34.0)
MCHC: 33.9 g/dL (ref 30.0–36.0)
MCV: 95.9 fL (ref 80.0–100.0)
Monocytes Absolute: 0.3 10*3/uL (ref 0.1–1.0)
Monocytes Relative: 8 %
Neutro Abs: 2.3 10*3/uL (ref 1.7–7.7)
Neutrophils Relative %: 64 %
Platelets: 103 10*3/uL — ABNORMAL LOW (ref 150–400)
RBC: 4.64 MIL/uL (ref 4.22–5.81)
RDW: 12.8 % (ref 11.5–15.5)
WBC: 3.7 10*3/uL — ABNORMAL LOW (ref 4.0–10.5)
nRBC: 0 % (ref 0.0–0.2)

## 2021-02-01 LAB — COMPREHENSIVE METABOLIC PANEL
ALT: 170 U/L — ABNORMAL HIGH (ref 0–44)
AST: 212 U/L — ABNORMAL HIGH (ref 15–41)
Albumin: 4.6 g/dL (ref 3.5–5.0)
Alkaline Phosphatase: 84 U/L (ref 38–126)
Anion gap: 18 — ABNORMAL HIGH (ref 5–15)
BUN: 12 mg/dL (ref 6–20)
CO2: 24 mmol/L (ref 22–32)
Calcium: 9.1 mg/dL (ref 8.9–10.3)
Chloride: 93 mmol/L — ABNORMAL LOW (ref 98–111)
Creatinine, Ser: 0.89 mg/dL (ref 0.61–1.24)
GFR, Estimated: >60 mL/min (ref >60)
Glucose, Bld: 189 mg/dL — ABNORMAL HIGH (ref 70–99)
Potassium: 3.4 mmol/L — ABNORMAL LOW (ref 3.5–5.1)
Sodium: 135 mmol/L (ref 135–145)
Total Bilirubin: 1.8 mg/dL — ABNORMAL HIGH (ref 0.3–1.2)
Total Protein: 7.5 g/dL (ref 6.5–8.1)

## 2021-02-01 LAB — ETHANOL: Alcohol, Ethyl (B): 205 mg/dL — ABNORMAL HIGH (ref <10)

## 2021-02-01 MED ORDER — LORAZEPAM 2 MG/ML IJ SOLN
1.0000 mg | Freq: Once | INTRAMUSCULAR | Status: AC
  Administered 2021-02-01: 1 mg via INTRAVENOUS
  Filled 2021-02-01: qty 1

## 2021-02-01 MED ORDER — SODIUM CHLORIDE 0.9 % IV BOLUS
1000.0000 mL | Freq: Once | INTRAVENOUS | Status: AC
  Administered 2021-02-01: 1000 mL via INTRAVENOUS

## 2021-02-01 NOTE — ED Notes (Signed)
Patient provided with urinal. Made aware urine sample is needed. 
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
## 2021-02-01 NOTE — Discharge Instructions (Addendum)
I have attached outpatient resources for local rehabilitation programs.  If you develop any new or worsening symptoms, you need to return the emergency department immediately for reevaluation.  It was a pleasure to meet you.

## 2021-02-01 NOTE — ED Triage Notes (Signed)
Per EMS- patient called EMS and states that he is afraid he might have a seizure from not having alcohol today. Patient last drank yesterday. Patient denies SI.

## 2021-02-22 ENCOUNTER — Encounter (HOSPITAL_COMMUNITY): Payer: Self-pay | Admitting: Emergency Medicine

## 2021-02-22 ENCOUNTER — Emergency Department (HOSPITAL_COMMUNITY)
Admission: EM | Admit: 2021-02-22 | Discharge: 2021-02-22 | Disposition: A | Discharge status: Home / Self Care | Payer: No Typology Code available for payment source | Attending: Emergency Medicine | Admitting: Emergency Medicine

## 2021-02-22 ENCOUNTER — Emergency Department (HOSPITAL_COMMUNITY): Payer: No Typology Code available for payment source

## 2021-02-22 DIAGNOSIS — F17210 Nicotine dependence, cigarettes, uncomplicated: Secondary | ICD-10-CM | POA: Diagnosis not present

## 2021-02-22 DIAGNOSIS — Y908 Blood alcohol level of 240 mg/100 ml or more: Secondary | ICD-10-CM | POA: Diagnosis not present

## 2021-02-22 DIAGNOSIS — F1010 Alcohol abuse, uncomplicated: Secondary | ICD-10-CM | POA: Insufficient documentation

## 2021-02-22 LAB — COMPREHENSIVE METABOLIC PANEL
ALT: 50 U/L — ABNORMAL HIGH (ref 0–44)
AST: 60 U/L — ABNORMAL HIGH (ref 15–41)
Albumin: 4.4 g/dL (ref 3.5–5.0)
Alkaline Phosphatase: 84 U/L (ref 38–126)
Anion gap: 12 (ref 5–15)
BUN: 13 mg/dL (ref 6–20)
CO2: 31 mmol/L (ref 22–32)
Calcium: 8.8 mg/dL — ABNORMAL LOW (ref 8.9–10.3)
Chloride: 100 mmol/L (ref 98–111)
Creatinine, Ser: 0.84 mg/dL (ref 0.61–1.24)
GFR, Estimated: >60 mL/min (ref >60)
Glucose, Bld: 139 mg/dL — ABNORMAL HIGH (ref 70–99)
Potassium: 3.7 mmol/L (ref 3.5–5.1)
Sodium: 143 mmol/L (ref 135–145)
Total Bilirubin: 0.9 mg/dL (ref 0.3–1.2)
Total Protein: 7.6 g/dL (ref 6.5–8.1)

## 2021-02-22 LAB — CBC WITH DIFFERENTIAL/PLATELET
Abs Immature Granulocytes: 0 10*3/uL (ref 0.00–0.07)
Basophils Absolute: 0 10*3/uL (ref 0.0–0.1)
Basophils Relative: 1 %
Eosinophils Absolute: 0.1 10*3/uL (ref 0.0–0.5)
Eosinophils Relative: 2 %
HCT: 50.1 % (ref 39.0–52.0)
Hemoglobin: 16.4 g/dL (ref 13.0–17.0)
Immature Granulocytes: 0 %
Lymphocytes Relative: 29 %
Lymphs Abs: 0.9 10*3/uL (ref 0.7–4.0)
MCH: 31.7 pg (ref 26.0–34.0)
MCHC: 32.7 g/dL (ref 30.0–36.0)
MCV: 96.9 fL (ref 80.0–100.0)
Monocytes Absolute: 0.3 10*3/uL (ref 0.1–1.0)
Monocytes Relative: 9 %
Neutro Abs: 1.9 10*3/uL (ref 1.7–7.7)
Neutrophils Relative %: 59 %
Platelets: 174 10*3/uL (ref 150–400)
RBC: 5.17 MIL/uL (ref 4.22–5.81)
RDW: 12.7 % (ref 11.5–15.5)
WBC: 3.2 10*3/uL — ABNORMAL LOW (ref 4.0–10.5)
nRBC: 0 % (ref 0.0–0.2)

## 2021-02-22 LAB — ETHANOL: Alcohol, Ethyl (B): 262 mg/dL — ABNORMAL HIGH (ref <10)

## 2021-02-22 MED ORDER — LORAZEPAM 2 MG/ML IJ SOLN
1.0000 mg | Freq: Once | INTRAMUSCULAR | Status: AC
  Administered 2021-02-22: 1 mg via INTRAVENOUS
  Filled 2021-02-22: qty 1

## 2021-02-22 MED ORDER — CHLORDIAZEPOXIDE HCL 25 MG PO CAPS: ORAL_CAPSULE | ORAL | 0 refills | Status: DC
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
## 2021-02-22 NOTE — Discharge Instructions (Addendum)
Stop drinking alcohol and follow-up with your family doctor next week.  We have prescribed some medicine to help with the symptoms when you stop drinking

## 2021-02-22 NOTE — ED Triage Notes (Signed)
Patient here via EMS reporting alcohol detox. Last drink was yesterday evening unsure of time. States that she has seizures with withdrawal.

## 2021-02-23 ENCOUNTER — Emergency Department (HOSPITAL_COMMUNITY)
Admission: EM | Admit: 2021-02-23 | Discharge: 2021-02-23 | Disposition: A | Discharge status: Home / Self Care | Payer: No Typology Code available for payment source | Attending: Emergency Medicine | Admitting: Emergency Medicine

## 2021-02-23 ENCOUNTER — Other Ambulatory Visit: Payer: Self-pay

## 2021-02-23 DIAGNOSIS — R000 Tachycardia, unspecified: Secondary | ICD-10-CM | POA: Insufficient documentation

## 2021-02-23 DIAGNOSIS — F17210 Nicotine dependence, cigarettes, uncomplicated: Secondary | ICD-10-CM | POA: Diagnosis not present

## 2021-02-23 DIAGNOSIS — R55 Syncope and collapse: Secondary | ICD-10-CM | POA: Insufficient documentation

## 2021-02-23 DIAGNOSIS — F10239 Alcohol dependence with withdrawal, unspecified: Secondary | ICD-10-CM | POA: Insufficient documentation

## 2021-02-23 DIAGNOSIS — F10930 Alcohol use, unspecified with withdrawal, uncomplicated: Secondary | ICD-10-CM

## 2021-02-23 DIAGNOSIS — R42 Dizziness and giddiness: Secondary | ICD-10-CM | POA: Insufficient documentation

## 2021-02-23 DIAGNOSIS — F10230 Alcohol dependence with withdrawal, uncomplicated: Secondary | ICD-10-CM

## 2021-02-23 LAB — CBC
HCT: 46.4 % (ref 39.0–52.0)
Hemoglobin: 16.2 g/dL (ref 13.0–17.0)
MCH: 33 pg (ref 26.0–34.0)
MCHC: 34.9 g/dL (ref 30.0–36.0)
MCV: 94.5 fL (ref 80.0–100.0)
Platelets: 168 10*3/uL (ref 150–400)
RBC: 4.91 MIL/uL (ref 4.22–5.81)
RDW: 12.8 % (ref 11.5–15.5)
WBC: 3.5 10*3/uL — ABNORMAL LOW (ref 4.0–10.5)
nRBC: 0 % (ref 0.0–0.2)

## 2021-02-23 LAB — MAGNESIUM: Magnesium: 1.3 mg/dL — ABNORMAL LOW (ref 1.7–2.4)

## 2021-02-23 LAB — BASIC METABOLIC PANEL
Anion gap: 12 (ref 5–15)
BUN: 10 mg/dL (ref 6–20)
CO2: 31 mmol/L (ref 22–32)
Calcium: 9 mg/dL (ref 8.9–10.3)
Chloride: 96 mmol/L — ABNORMAL LOW (ref 98–111)
Creatinine, Ser: 1.05 mg/dL (ref 0.61–1.24)
GFR, Estimated: >60 mL/min (ref >60)
Glucose, Bld: 104 mg/dL — ABNORMAL HIGH (ref 70–99)
Potassium: 3.5 mmol/L (ref 3.5–5.1)
Sodium: 139 mmol/L (ref 135–145)

## 2021-02-23 MED ORDER — MAGNESIUM OXIDE 400 (241.3 MG) MG PO TABS
400.0000 mg | ORAL_TABLET | Freq: Once | ORAL | Status: AC
  Administered 2021-02-23: 400 mg via ORAL
  Filled 2021-02-23: qty 1

## 2021-02-23 MED ORDER — SODIUM CHLORIDE 0.9 % IV BOLUS
1000.0000 mL | Freq: Once | INTRAVENOUS | Status: AC
  Administered 2021-02-23: 1000 mL via INTRAVENOUS

## 2021-02-23 MED ORDER — MAGNESIUM SULFATE IN D5W 1-5 GM/100ML-% IV SOLN
1.0000 g | Freq: Once | INTRAVENOUS | Status: DC
  Filled 2021-02-23: qty 100

## 2021-02-23 MED ORDER — LORAZEPAM 2 MG/ML IJ SOLN
2.0000 mg | Freq: Once | INTRAMUSCULAR | Status: AC
  Administered 2021-02-23: 2 mg via INTRAVENOUS
  Filled 2021-02-23: qty 1

## 2021-02-23 NOTE — ED Notes (Signed)
Patient alert and oriented at discharge.  Patient taking a bus home.  Discharge instructions reviewed and verbalized understanding.

## 2021-02-23 NOTE — ED Triage Notes (Signed)
C/o near syncope while standing earlier today, stated it almost happened yesterday as well.
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
## 2021-02-23 NOTE — Discharge Instructions (Signed)
Call your primary care doctor or specialist as discussed in the next 2-3 days.   Return immediately back to the ER if:  Your symptoms worsen within the next 12-24 hours. You develop new symptoms such as new fevers, persistent vomiting, new pain, shortness of breath, or new weakness or numbness, or if you have any other concerns.  

## 2022-07-01 ENCOUNTER — Emergency Department (HOSPITAL_COMMUNITY): Payer: No Typology Code available for payment source

## 2022-07-01 ENCOUNTER — Encounter (HOSPITAL_COMMUNITY): Payer: Self-pay

## 2022-07-01 ENCOUNTER — Emergency Department (HOSPITAL_COMMUNITY)
Admission: EM | Admit: 2022-07-01 | Discharge: 2022-07-01 | Discharge status: Against Medical Advice | Payer: No Typology Code available for payment source | Attending: Emergency Medicine | Admitting: Emergency Medicine

## 2022-07-01 ENCOUNTER — Other Ambulatory Visit: Payer: Self-pay

## 2022-07-01 DIAGNOSIS — R0789 Other chest pain: Secondary | ICD-10-CM | POA: Insufficient documentation

## 2022-07-01 DIAGNOSIS — Z5321 Procedure and treatment not carried out due to patient leaving prior to being seen by health care provider: Secondary | ICD-10-CM | POA: Insufficient documentation

## 2022-07-01 DIAGNOSIS — R55 Syncope and collapse: Secondary | ICD-10-CM | POA: Insufficient documentation

## 2022-07-01 DIAGNOSIS — R569 Unspecified convulsions: Secondary | ICD-10-CM | POA: Insufficient documentation

## 2022-07-01 DIAGNOSIS — I10 Essential (primary) hypertension: Secondary | ICD-10-CM | POA: Diagnosis not present

## 2022-07-01 LAB — CBC WITH DIFFERENTIAL/PLATELET
Abs Immature Granulocytes: 0.01 10*3/uL (ref 0.00–0.07)
Basophils Absolute: 0 10*3/uL (ref 0.0–0.1)
Basophils Relative: 1 %
Eosinophils Absolute: 0.1 10*3/uL (ref 0.0–0.5)
Eosinophils Relative: 1 %
HCT: 45.1 % (ref 39.0–52.0)
Hemoglobin: 15.4 g/dL (ref 13.0–17.0)
Immature Granulocytes: 0 %
Lymphocytes Relative: 20 %
Lymphs Abs: 1.3 10*3/uL (ref 0.7–4.0)
MCH: 30.7 pg (ref 26.0–34.0)
MCHC: 34.1 g/dL (ref 30.0–36.0)
MCV: 90 fL (ref 80.0–100.0)
Monocytes Absolute: 0.6 10*3/uL (ref 0.1–1.0)
Monocytes Relative: 10 %
Neutro Abs: 4.4 10*3/uL (ref 1.7–7.7)
Neutrophils Relative %: 68 %
Platelets: 186 10*3/uL (ref 150–400)
RBC: 5.01 MIL/uL (ref 4.22–5.81)
RDW: 12 % (ref 11.5–15.5)
WBC: 6.3 10*3/uL (ref 4.0–10.5)
nRBC: 0 % (ref 0.0–0.2)

## 2022-07-01 LAB — CBG MONITORING, ED: Glucose-Capillary: 80 mg/dL (ref 70–99)

## 2022-07-01 LAB — TROPONIN I (HIGH SENSITIVITY)
Troponin I (High Sensitivity): 7 ng/L (ref <18)
Troponin I (High Sensitivity): 8 ng/L (ref <18)

## 2022-07-01 LAB — COMPREHENSIVE METABOLIC PANEL
ALT: 22 U/L (ref 0–44)
AST: 36 U/L (ref 15–41)
Albumin: 4.3 g/dL (ref 3.5–5.0)
Alkaline Phosphatase: 63 U/L (ref 38–126)
Anion gap: 11 (ref 5–15)
BUN: 11 mg/dL (ref 6–20)
CO2: 26 mmol/L (ref 22–32)
Calcium: 9.3 mg/dL (ref 8.9–10.3)
Chloride: 99 mmol/L (ref 98–111)
Creatinine, Ser: 0.92 mg/dL (ref 0.61–1.24)
GFR, Estimated: >60 mL/min (ref >60)
Glucose, Bld: 108 mg/dL — ABNORMAL HIGH (ref 70–99)
Potassium: 4.1 mmol/L (ref 3.5–5.1)
Sodium: 136 mmol/L (ref 135–145)
Total Bilirubin: 2.2 mg/dL — ABNORMAL HIGH (ref 0.3–1.2)
Total Protein: 6.7 g/dL (ref 6.5–8.1)

## 2022-07-01 NOTE — ED Provider Triage Note (Addendum)
Emergency Medicine Provider Triage Evaluation Note  Sean Fields , a 41 y.o. male  was evaluated in triage.  Pt complains of near syncopal episode this morning.  Patient states that he was was getting out of the shower this morning when he felt "overwhelmed."  He feel like he was going to pass out and have a seizure but did not.  He noted some chest tightness/discomfort after the episode that is persisted until now.  Patient still feels like he is "spacing out" which is abnormal for him.  He denies fever, chills, night sweats, shortness of breath, abdominal pain, nausea/vomiting/diarrhea, urinary symptoms.  Last drink was yesterday he states he had 5-6 beers.  He has not taken any of his home medicines for the past 3 months.  Review of Systems  Positive: See above Negative:   Physical Exam  BP (!) 131/96 (BP Location: Right Arm)   Pulse 72   Temp 99 F (37.2 C) (Oral)   Resp 16   Ht 5\' 7"  (1.702 m)   Wt 77.1 kg   SpO2 97%   BMI 26.63 kg/m  Gen:   Awake, no distress   Resp:  Normal effort  MSK:   Moves extremities without difficulty  Other:  PERRLA bilaterally.  EOMs intact bilaterally.  Cranial 3 through 12 grossly intact.  No obvious murmur, gallops, rubs.  Medical Decision Making  Medically screening exam initiated at 11:36 AM.  Appropriate orders placed.  was informed that the remainder of the evaluation will be completed by another provider, this initial triage assessment does not replace that evaluation, and the importance of remaining in the ED until their evaluation is complete.     Norberta Keens, Peter Garter 07/01/22 1138    08/31/22, Peter Garter 07/01/22 1140

## 2022-07-01 NOTE — ED Notes (Signed)
Pt left [facility] due to long wait times.  

## 2022-07-01 NOTE — ED Triage Notes (Signed)
Pt from home with PTAR, states he feels like he is going to have a seizure. HTN with EMS. Pt a.o

## 2022-07-01 NOTE — ED Triage Notes (Signed)
Pt states he felt like he was going to pass out and have a seizure after getting out of the shower, last seizure was about 1 year ago, denies taking seizure meds. States he is currently feeling anxious.

## 2022-08-17 IMAGING — DX DG SHOULDER 1V*L*
3 series · 3 of 3 positions shown · non-contrast
Comparison: None.

CLINICAL DATA: 39-year-old male with left shoulder pain

EXAM:
LEFT SHOULDER

[shoulder ap]
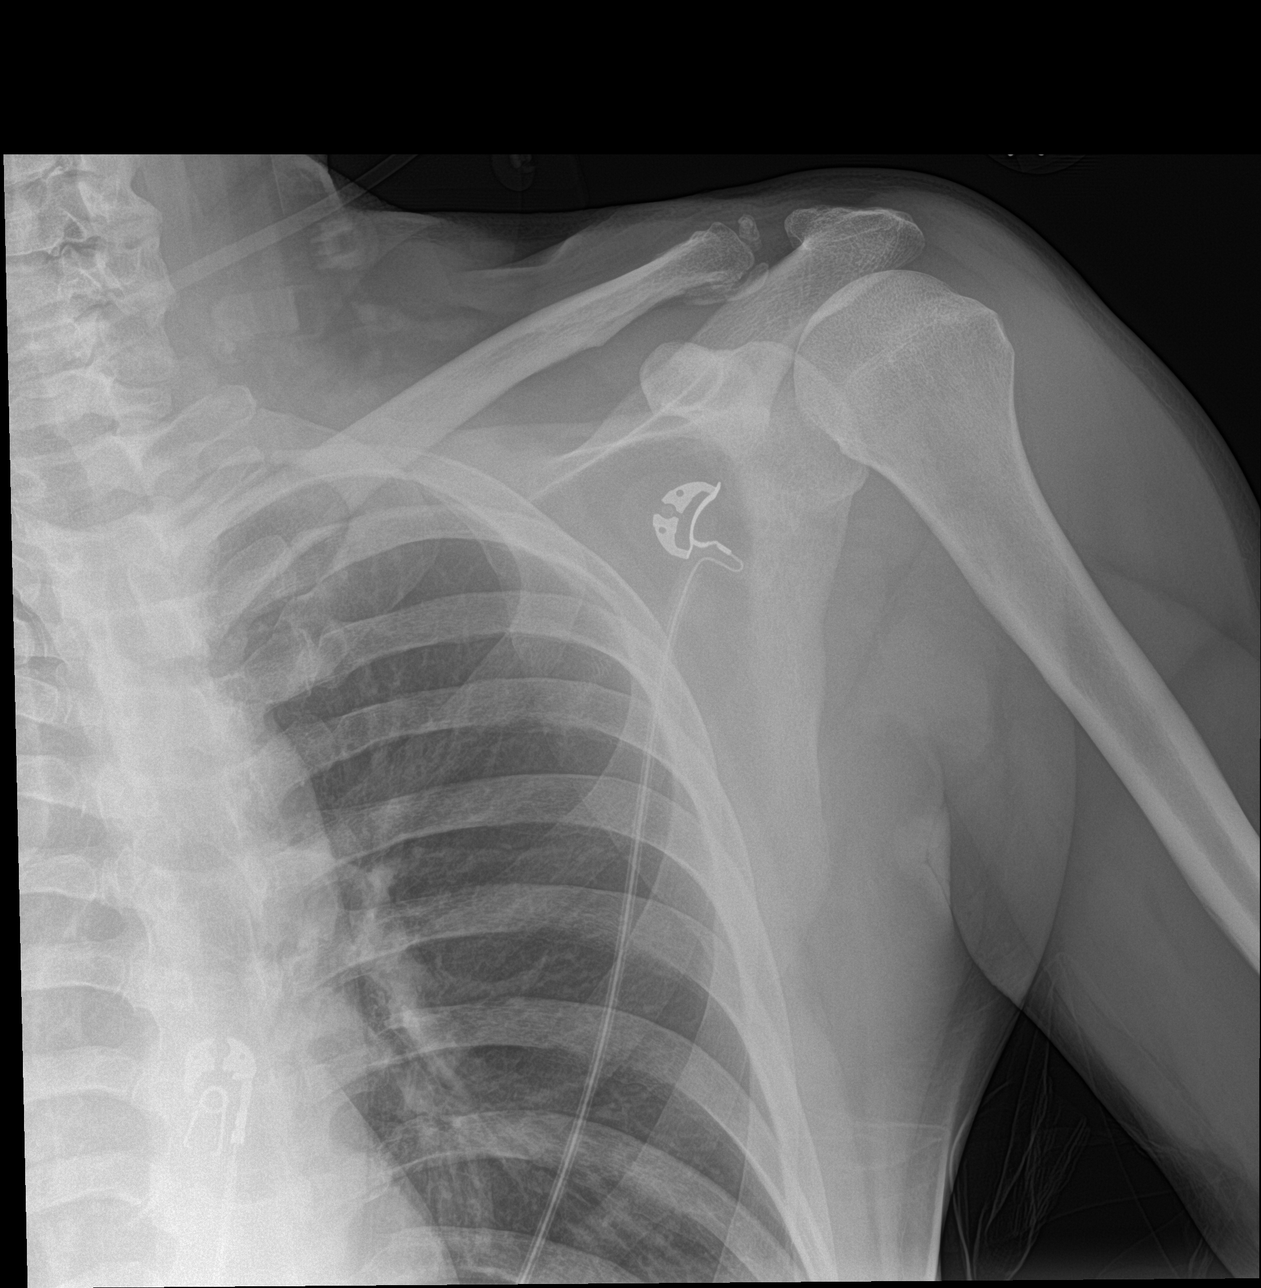

[shoulder obl (1 of 2)]
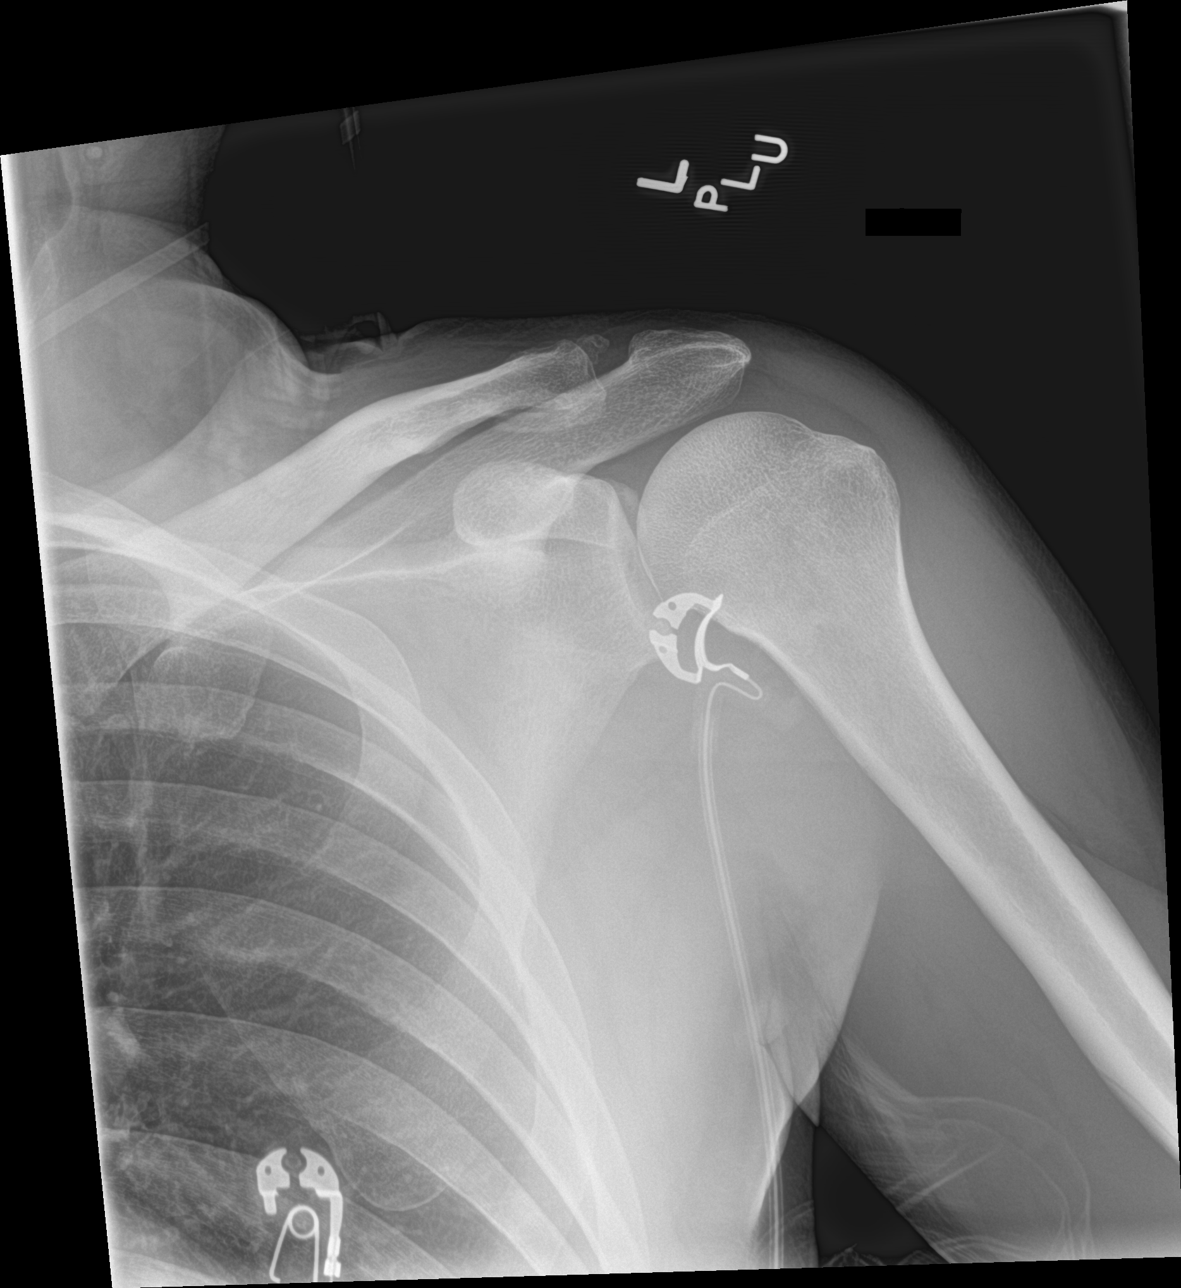

[shoulder obl (2 of 2)]
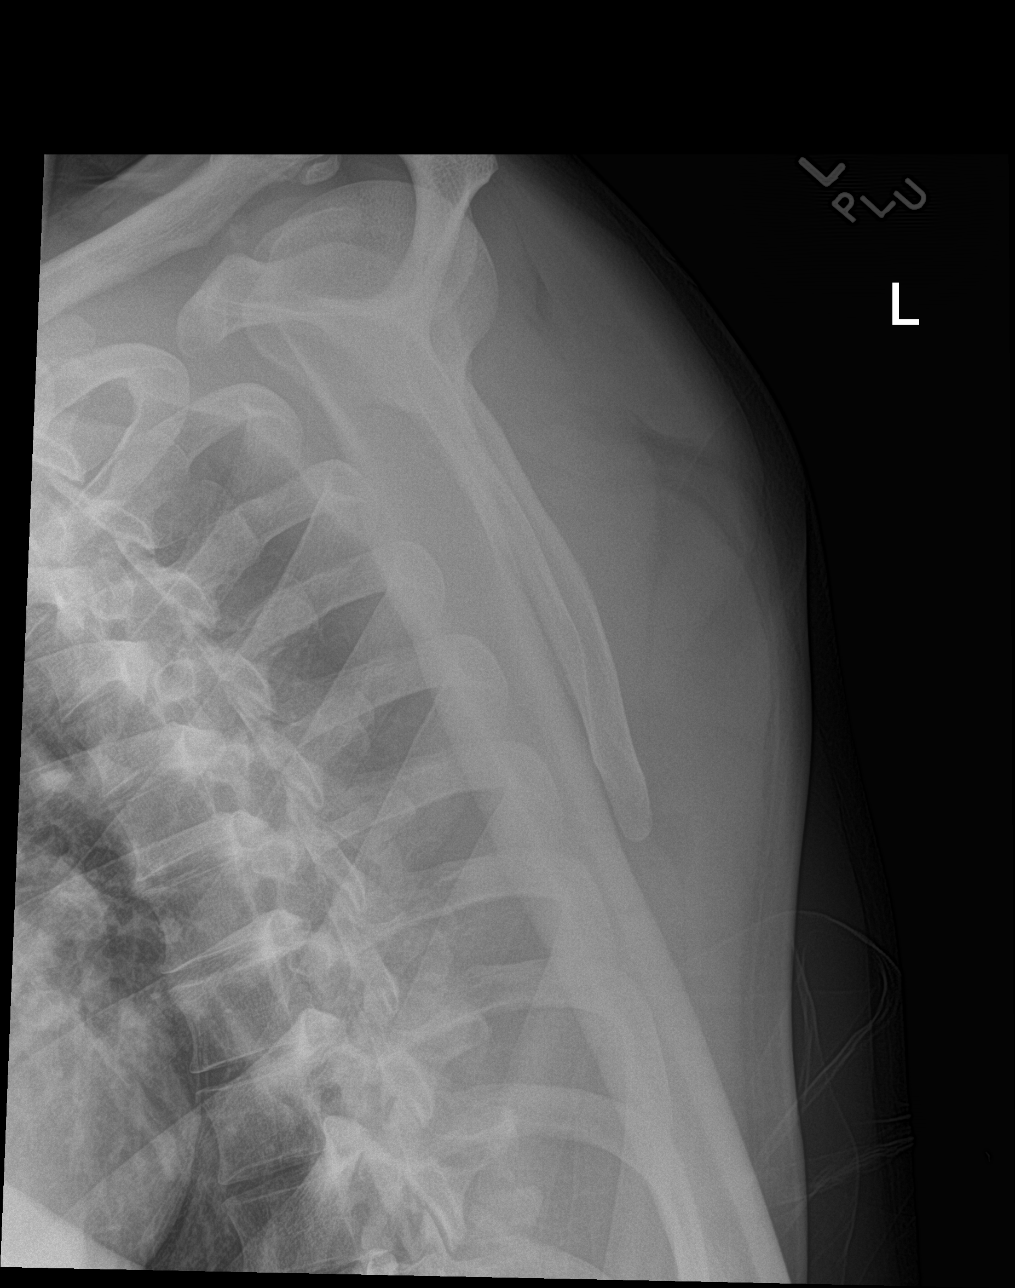

[3 of 3 positions shown; findings below may reference images not displayed]

FINDINGS: No acute displaced fracture. Glenohumeral joint appears congruent.
Widening of the AC joint without erosive changes. Degenerative
changes present. No radiopaque foreign body. Unremarkable appearance
of visualized thorax.
IMPRESSION: Negative for acute bony abnormality.

Degenerative changes of the AC joint
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
## 2022-12-09 ENCOUNTER — Ambulatory Visit (HOSPITAL_COMMUNITY)
Admission: EM | Admit: 2022-12-09 | Discharge: 2022-12-09 | Disposition: A | Discharge status: Home / Self Care | Payer: No Typology Code available for payment source

## 2022-12-09 ENCOUNTER — Ambulatory Visit (HOSPITAL_COMMUNITY)
Admission: EM | Admit: 2022-12-09 | Discharge: 2022-12-09 | Discharge status: Against Medical Advice | Payer: No Typology Code available for payment source

## 2022-12-09 ENCOUNTER — Encounter (HOSPITAL_COMMUNITY): Payer: Self-pay | Admitting: Emergency Medicine

## 2022-12-09 ENCOUNTER — Ambulatory Visit
Admission: EM | Admit: 2022-12-09 | Discharge: 2022-12-09 | Discharge status: Absent without leave | Payer: No Typology Code available for payment source

## 2022-12-09 ENCOUNTER — Other Ambulatory Visit: Payer: Self-pay

## 2022-12-09 DIAGNOSIS — S01511D Laceration without foreign body of lip, subsequent encounter: Secondary | ICD-10-CM | POA: Diagnosis not present

## 2022-12-09 DIAGNOSIS — Z4802 Encounter for removal of sutures: Secondary | ICD-10-CM
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
## 2022-12-09 NOTE — ED Triage Notes (Signed)
Sutures placed on Sunday 12/05/2022.  Sutures on upper, left lip

## 2022-12-09 NOTE — ED Notes (Signed)
Post suture removal, Jodie, NP, evaluated the wound.  Jodie, NP,  discharged patient

## 2023-01-13 ENCOUNTER — Encounter (HOSPITAL_COMMUNITY): Payer: Self-pay

## 2023-01-13 ENCOUNTER — Other Ambulatory Visit: Payer: Self-pay

## 2023-01-13 ENCOUNTER — Emergency Department (HOSPITAL_COMMUNITY)
Admission: EM | Admit: 2023-01-13 | Discharge: 2023-01-13 | Disposition: A | Discharge status: Home / Self Care | Payer: No Typology Code available for payment source | Attending: Emergency Medicine | Admitting: Emergency Medicine

## 2023-01-13 DIAGNOSIS — L509 Urticaria, unspecified: Secondary | ICD-10-CM | POA: Insufficient documentation

## 2023-01-13 MED ORDER — PREDNISONE 20 MG PO TABS
60.0000 mg | ORAL_TABLET | Freq: Once | ORAL | Status: AC
  Administered 2023-01-13: 60 mg via ORAL
  Filled 2023-01-13: qty 3

## 2023-01-13 MED ORDER — FAMOTIDINE 20 MG PO TABS
20.0000 mg | ORAL_TABLET | Freq: Once | ORAL | Status: AC
  Administered 2023-01-13: 20 mg via ORAL
  Filled 2023-01-13: qty 1

## 2023-01-13 MED ORDER — FAMOTIDINE 20 MG PO TABS: 20.0000 mg | ORAL_TABLET | Freq: Two times a day (BID) | ORAL | 0 refills | Status: DC

## 2023-01-13 MED ORDER — PREDNISONE 20 MG PO TABS: ORAL_TABLET | ORAL | 0 refills | Status: DC

## 2023-01-13 NOTE — ED Provider Notes (Signed)
[facility] Provider Note   CSN: [id number] Arrival date & time: 01/13/23  0330     History  Chief Complaint  Patient presents with   Urticaria    Sean Fields is a 42 y.o. male.  The history is provided by the patient.  Urticaria This is a new problem. The current episode started more than 2 days ago. The problem occurs constantly. The problem has not changed since onset.Pertinent negatives include no chest pain, no abdominal pain, no headaches and no shortness of breath. Nothing aggravates the symptoms. Nothing relieves the symptoms. Treatments tried: a dose of benadryl x 2. The treatment provided no relief.       Home Medications Prior to Admission medications   Medication Sig Start Date End Date Taking? Authorizing Provider  chlordiazePOXIDE (LIBRIUM) 25 MG capsule 33m PO TID x 1D, then 25-533mPO BID X 1D, then 25-5057mO QD X 1D Patient not taking: Reported on 12/09/2022 02/22/21   ZamMilton FergusonD  cholecalciferol (VITAMIN D3) 25 MCG (1000 UNIT) tablet Take 1,000 Units by mouth daily.    [provider]  diclofenac Sodium (VOLTAREN) 1 % GEL Apply 4 g topically every 6 (six) hours as needed (for pain).    [provider]  folic acid (FOLVITE) 1 MG tablet Take 1 tablet (1 mg total) by mouth daily. 04/22/20   SheRaiford Nobletif, DO  hydrOXYzine (VISTARIL) 25 MG capsule Take 25 mg by mouth 2 (two) times daily as needed for anxiety.    [provider]  Multiple Vitamin (MULTIVITAMIN WITH MINERALS) TABS tablet Take 1 tablet by mouth daily. 04/22/20   SheRaiford Nobletif, DO  omeprazole (PRILOSEC) 20 MG capsule Take 20 mg by mouth daily.    [provider]  polyethylene glycol (MIRALAX / GLYCOLAX) 17 g packet Take 17 g by mouth daily as needed for mild constipation. 04/21/20   SheRaiford Nobletif, DO  QUEtiapine (SEROQUEL) 100 MG tablet Take 50 mg by mouth at bedtime. Patient not taking: Reported on  12/09/2022    [provider]  thiamine 100 MG tablet Take 1 tablet (100 mg total) by mouth daily. 04/22/20   SheRaiford Nobletif, DO      Allergies    Smallpox virus vaccine live and Sumatriptan    Review of Systems   Review of Systems  Constitutional:  Negative for fever.  HENT:  Negative for drooling and facial swelling.   Respiratory:  Negative for shortness of breath.   Cardiovascular:  Negative for chest pain.  Gastrointestinal:  Negative for abdominal pain.  Skin:  Positive for rash.  Neurological:  Negative for headaches.  All other systems reviewed and are negative.   Physical Exam Updated Vital Signs BP (!) 155/108   Pulse 79   Temp 98.1 F (36.7 C)   Resp 18   Ht 5' 7"$  (1.702 m)   Wt 77.1 kg   SpO2 100%   BMI 26.63 kg/m  Physical Exam Vitals and nursing note reviewed.  Constitutional:      General: He is not in acute distress.    Appearance: Normal appearance. He is well-developed. He is not diaphoretic.  HENT:     Head: Normocephalic and atraumatic.     Nose: Nose normal.  Eyes:     Conjunctiva/sclera: Conjunctivae normal.     Pupils: Pupils are equal, round, and reactive to light.  Cardiovascular:     Rate and Rhythm: Normal rate and  regular rhythm.     Pulses: Normal pulses.     Heart sounds: Normal heart sounds.  Pulmonary:     Effort: Pulmonary effort is normal.     Breath sounds: Normal breath sounds. No wheezing or rales.  Abdominal:     General: Bowel sounds are normal.     Palpations: Abdomen is soft.     Tenderness: There is no abdominal tenderness. There is no guarding or rebound.  Musculoskeletal:        General: Normal range of motion.     Cervical back: Normal range of motion and neck supple.  Skin:    General: Skin is warm and dry.     Capillary Refill: Capillary refill takes less than 2 seconds.     Comments: A few Scattered wheals of the torso   Neurological:     General: No focal deficit present.     Mental Status: He  is alert and oriented to person, place, and time.  Psychiatric:        Thought Content: Thought content normal.     ED Results / Procedures / Treatments   Labs (all labs ordered are listed, but only abnormal results are displayed) Labs Reviewed - No data to display  EKG None  Radiology No results found.  Procedures Procedures    Medications Ordered in ED Medications  predniSONE (DELTASONE) tablet 60 mg (has no administration in time range)  famotidine (PEPCID) tablet 20 mg (has no administration in time range)    ED Course/ Medical Decision Making/ A&P                             Medical Decision Making Itching and wheals x 2 days   Amount and/or Complexity of Data Reviewed External Data Reviewed: notes.    Details: Previous notes reivewed   Risk Prescription drug management. Risk Details: Well appearing no lip or oral swelling only a few scattered wheals.  Will start steroids and pepcid in addition to home benadryl.  Stable for discharge with close follow up.  Strict return    Final Clinical Impression(s) / ED Diagnoses Return for intractable cough, coughing up blood, fevers > 100.4 unrelieved by medication, shortness of breath, intractable vomiting, chest pain, shortness of breath, weakness, numbness, changes in speech, facial asymmetry, abdominal pain, passing out, Inability to tolerate liquids or food, cough, altered mental status or any concerns. No signs of systemic illness or infection. The patient is nontoxic-appearing on exam and vital signs are within normal limits.  I have reviewed the triage vital signs and the nursing notes. Pertinent labs & imaging results that were available during my care of the patient were reviewed by me and considered in my medical decision making (see chart for details). After history, exam, and medical workup I feel the patient has been appropriately medically screened and is safe for discharge home. Pertinent diagnoses were  discussed with the patient. Patient was given return precautions.       Cassady Stanczak, MD 01/13/23 [phone]

## 2023-01-13 NOTE — ED Triage Notes (Signed)
Generalized itching and hives x 2 days.   Recently eaten some new mushrooms, and new trail mix.   No known food allergies.   Has been taking 25m benadryl every couple hours with improvement but hives returns. Last dose 270mBenadryl @ 0245.   NO respiratory sx of throat swelling.

## 2023-12-28 ENCOUNTER — Encounter: Payer: Self-pay | Admitting: Podiatry

## 2023-12-28 ENCOUNTER — Ambulatory Visit (INDEPENDENT_AMBULATORY_CARE_PROVIDER_SITE_OTHER): Payer: No Typology Code available for payment source | Admitting: Podiatry

## 2023-12-28 ENCOUNTER — Ambulatory Visit: Payer: No Typology Code available for payment source

## 2023-12-28 DIAGNOSIS — M779 Enthesopathy, unspecified: Secondary | ICD-10-CM | POA: Diagnosis not present

## 2023-12-28 DIAGNOSIS — M722 Plantar fascial fibromatosis: Secondary | ICD-10-CM | POA: Diagnosis not present

## 2023-12-28 NOTE — Progress Notes (Signed)
   Chief Complaint  Patient presents with   Foot Pain    "My feet hurt in the heels, arch, and where my big toe bends.  I also want to know what to do about the smell." N - heel and arch pain L - plantar heel and arch bilateral D - 3-4 years O - suddenly, started to hurt more often C - sharp pains A - when I first get up T - used to wear orthotics,    HPI: 43 y.o. male presenting today as a new patient for evaluation of chronic pain and tenderness to the bilateral heels.  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  Fully disabled veteran.  Patient experiences pain and tenderness to the feet.  A few years ago he did have some custom orthotics which help significantly alleviate his foot pain.  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  Dermatology: Skin is warm, dry and supple bilateral lower extremities.   Vascular: Palpable pedal pulses bilaterally. Capillary refill within normal limits.  No appreciable edema.  No erythema.  Neurological: Grossly intact via light touch  Musculoskeletal Exam: No pedal deformities noted.  There is some tenderness with palpation along the plantar heel bilateral extending into the medial longitudinal arch of the foot  Radiographic Exam B/L feet 12/28/2023:  Normal osseous mineralization.  Joint spaces preserved.  No fractures or osseous irregularities noted.  Small plantar heel spurs noted bilateral  Assessment/Plan of Care: 1.  Chronic plantar fasciitis bilateral  -Patient evaluated.  X-rays reviewed -Prescription for custom molded orthotics was provided for the patient to take to the [place] for authorization and approval -Continue wearing good supportive tennis shoes and sneakers -Return to clinic as needed       Felecia Shelling, DPM [facility]  Dr. Felecia Shelling, DPM    [address] [place], [place] [address]                Office [phone]  Fax [phone]

## 2024-06-03 ENCOUNTER — Emergency Department (HOSPITAL_COMMUNITY)
Admission: EM | Admit: 2024-06-03 | Discharge: 2024-06-03 | Disposition: A | Discharge status: Home / Self Care | Attending: Emergency Medicine | Admitting: Emergency Medicine

## 2024-06-03 ENCOUNTER — Encounter (HOSPITAL_COMMUNITY): Payer: Self-pay | Admitting: *Deleted

## 2024-06-03 ENCOUNTER — Other Ambulatory Visit: Payer: Self-pay

## 2024-06-03 DIAGNOSIS — Z23 Encounter for immunization: Secondary | ICD-10-CM | POA: Diagnosis not present

## 2024-06-03 DIAGNOSIS — Z2914 Encounter for prophylactic rabies immune globin: Secondary | ICD-10-CM | POA: Diagnosis not present

## 2024-06-03 DIAGNOSIS — S71131A Puncture wound without foreign body, right thigh, initial encounter: Secondary | ICD-10-CM | POA: Insufficient documentation

## 2024-06-03 DIAGNOSIS — W540XXA Bitten by dog, initial encounter: Secondary | ICD-10-CM | POA: Insufficient documentation

## 2024-06-03 MED ORDER — AMOXICILLIN-POT CLAVULANATE 875-125 MG PO TABS: 1.0000 | ORAL_TABLET | Freq: Two times a day (BID) | ORAL | 0 refills | Status: DC

## 2024-06-03 MED ORDER — RABIES IMMUNE GLOBULIN 1500 UNIT/10ML IJ SOLN
20.0000 [IU]/kg | Freq: Once | INTRAMUSCULAR | Status: AC
  Administered 2024-06-03: 1500 [IU] via INTRAMUSCULAR
  Filled 2024-06-03 (×2): qty 10

## 2024-06-03 MED ORDER — TETANUS-DIPHTH-ACELL PERTUSSIS 5-2.5-18.5 LF-MCG/0.5 IM SUSY
0.5000 mL | PREFILLED_SYRINGE | Freq: Once | INTRAMUSCULAR | Status: AC
Start: 2024-06-03 — End: 2024-06-03
  Administered 2024-06-03: 0.5 mL via INTRAMUSCULAR
  Filled 2024-06-03: qty 0.5

## 2024-06-03 MED ORDER — RABIES VACCINE, PCEC IM SUSR
1.0000 mL | Freq: Once | INTRAMUSCULAR | Status: AC
  Administered 2024-06-03: 1 mL via INTRAMUSCULAR
  Filled 2024-06-03: qty 1

## 2024-06-03 NOTE — Discharge Instructions (Addendum)
                                  RABIES VACCINE  FOLLOW UP  Patient's Name: Sean Fields                     Original Order Date:06/03/2024  Medical Record Number: [id number]  ED Physician: Randol Simmonds, MD Primary Diagnosis: Rabies Exposure       PCP: [facility]  Patient Phone Number: (home) [phone] (home)    (cell)  Telephone Information:  Mobile [phone]    Species of Animal:  Dog   You have been seen in the Emergency Department for a possible rabies exposure. It's very important you return for the additional vaccine doses.  Please call the clinic listed below for hours of operation.   Clinic that will administer your rabies vaccines: [facility]    DAY 0:  06/03/2024      DAY 3:  06/06/2024       DAY 7:  06/10/2024     DAY 14:  06/17/2024         The 5th vaccine injection is considered for immune compromised patients only.  DAY 28:  07/01/2024

## 2024-06-03 NOTE — ED Provider Notes (Signed)
  EMERGENCY DEPARTMENT AT [facility] Provider Note   CSN: [id number] Arrival date & time: 06/03/24  9358     Patient presents with: Animal Bite   Sean Fields is a 43 y.o. male.    Animal Bite    Patient has a history of PTSD and seizures.  He presents ED for evaluation after an animal bite.  Patient was walking his dog when other dogs attacked his dog.  Patient was trying to separate the dogs when he the patient was bitten on his right inner thigh by the other dogs.  Patient thinks he knows where the owners of the dog live but he is not sure.  Patient sustained wounds to his inner thigh.  He denies any other injuries.  Prior to Admission medications   Medication Sig Start Date End Date Taking? Authorizing Provider  amoxicillin -clavulanate (AUGMENTIN ) 875-125 MG tablet Take 1 tablet by mouth every 12 (twelve) hours. 06/03/24  Yes Randol Simmonds, MD  chlordiazePOXIDE  (LIBRIUM ) 25 MG capsule 50mg  PO TID x 1D, then 25-50mg  PO BID X 1D, then 25-50mg  PO QD X 1D Patient not taking: Reported on 12/28/2023 02/22/21   Suzette Pac, MD  cholecalciferol  (VITAMIN D3) 25 MCG (1000 UNIT) tablet Take 1,000 Units by mouth daily.    [provider]  diclofenac Sodium (VOLTAREN) 1 % GEL Apply 4 g topically every 6 (six) hours as needed (for pain).    [provider]  famotidine  (PEPCID ) 20 MG tablet Take 1 tablet (20 mg total) by mouth 2 (two) times daily. 01/13/23   Palumbo, April, MD  folic acid  (FOLVITE ) 1 MG tablet Take 1 tablet (1 mg total) by mouth daily. 04/22/20   Sherrill Cable Latif, DO  hydrOXYzine  (VISTARIL ) 25 MG capsule Take 25 mg by mouth 2 (two) times daily as needed for anxiety.    [provider]  Multiple Vitamin (MULTIVITAMIN WITH MINERALS) TABS tablet Take 1 tablet by mouth daily. 04/22/20   Sheikh, Omair Latif, DO  omeprazole (PRILOSEC) 20 MG capsule Take 20 mg by mouth daily.    [provider]  polyethylene glycol (MIRALAX  /  GLYCOLAX ) 17 g packet Take 17 g by mouth daily as needed for mild constipation. 04/21/20   Sheikh, Omair Latif, DO  predniSONE  (DELTASONE ) 20 MG tablet 3 tabs po day one, then 2 po daily x 4 days 01/13/23   Palumbo, April, MD  QUEtiapine  (SEROQUEL ) 100 MG tablet Take 50 mg by mouth at bedtime. Patient not taking: Reported on 12/09/2022    [provider]  thiamine  100 MG tablet Take 1 tablet (100 mg total) by mouth daily. 04/22/20   Sherrill Cable Donovan, DO    Allergies: Smallpox virus vaccine live and Sumatriptan    Review of Systems  Updated Vital Signs BP 116/87 (BP Location: Right Arm)   Pulse 90   Temp 97.9 F (36.6 C)   Resp 18   Ht 1.702 m (5' 7)   Wt 76.2 kg   SpO2 100%   BMI 26.31 kg/m   Physical Exam Vitals and nursing note reviewed.  Constitutional:      General: He is not in acute distress.    Appearance: He is well-developed.  HENT:     Head: Normocephalic and atraumatic.     Right Ear: External ear normal.     Left Ear: External ear normal.  Eyes:     General: No scleral icterus.       Right eye: No discharge.  Left eye: No discharge.     Conjunctiva/sclera: Conjunctivae normal.  Neck:     Trachea: No tracheal deviation.  Cardiovascular:     Rate and Rhythm: Normal rate.  Pulmonary:     Effort: Pulmonary effort is normal. No respiratory distress.     Breath sounds: No stridor.  Abdominal:     General: There is no distension.  Musculoskeletal:        General: No swelling.     Cervical back: Neck supple.     Comments: Several puncture wounds consistent with a bite wound on his right posterior inner thigh, some bruising noted around the wound, no active bleeding  Skin:    General: Skin is warm and dry.     Findings: No rash.  Neurological:     Mental Status: He is alert. Mental status is at baseline.     Cranial Nerves: No dysarthria or facial asymmetry.     Motor: No seizure activity.     (all labs ordered are listed, but only  abnormal results are displayed) Labs Reviewed - No data to display  EKG: None  Radiology: No results found.   Procedures   Medications Ordered in the ED  Rabies Immune Globulin  SOLN 1,500 Units (has no administration in time range)  rabies vaccine  (RABAVERT ) injection 1 mL (1 mL Intramuscular Given 06/03/24 0944)  Tdap (BOOSTRIX ) injection 0.5 mL (0.5 mLs Intramuscular Given 06/03/24 9187)                                    Medical Decision Making Risk Prescription drug management.   Patient has several bite wounds to his inner thigh.  Wounds are less than 1 cm in size and do not require any suture or wound closure.  Will irrigate wounds.  Apply antibiotic ointment.  Discussed rabies vaccination with the patient.  He thinks he may be able to get in touch with the ED owners but is not entirely sure.  Will plan on notification of animal control and will initiate first dose of rabies vaccination.     Final diagnoses:  Dog bite, initial encounter    ED Discharge Orders          Ordered    amoxicillin -clavulanate (AUGMENTIN ) 875-125 MG tablet  Every 12 hours        06/03/24 0744               Randol Simmonds, MD 06/03/24 1007

## 2024-06-03 NOTE — ED Triage Notes (Signed)
 Patient states he was walking his dog and another dog went for his dog he grabbed up his dog and was bitten by another dog to his right inner thigh. States it happened around 1145 pm, bleeding controled.

## 2024-06-03 NOTE — ED Notes (Signed)
 Wound cleaned, vaccine in right delt, and immunoglobulin administered around wounds  and in thighs.  Wound wrapped with kerlix and an ace.

## 2024-08-11 ENCOUNTER — Encounter (HOSPITAL_COMMUNITY): Payer: Self-pay

## 2024-08-11 ENCOUNTER — Ambulatory Visit (HOSPITAL_COMMUNITY): Payer: Self-pay | Admitting: Emergency Medicine

## 2024-08-11 ENCOUNTER — Ambulatory Visit (HOSPITAL_COMMUNITY)
Admission: EM | Admit: 2024-08-11 | Discharge: 2024-08-11 | Disposition: A | Discharge status: Home / Self Care | Attending: Emergency Medicine | Admitting: Emergency Medicine

## 2024-08-11 ENCOUNTER — Ambulatory Visit (INDEPENDENT_AMBULATORY_CARE_PROVIDER_SITE_OTHER)

## 2024-08-11 DIAGNOSIS — M25552 Pain in left hip: Secondary | ICD-10-CM

## 2024-08-11 DIAGNOSIS — M25562 Pain in left knee: Secondary | ICD-10-CM

## 2024-08-11 MED ORDER — NAPROXEN 500 MG PO TABS: 500.0000 mg | ORAL_TABLET | Freq: Two times a day (BID) | ORAL | 0 refills | Status: AC
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
## 2024-08-11 NOTE — Discharge Instructions (Addendum)
 I am suspicious that you have the beginning of osteoarthritis, this is somewhat difficult to see on x-ray.  Please take the daily naproxen  to help with pain and function.  Can alternate or supplement with 500 mg of Tylenol  as needed.  Please follow-up with sports medicine as they can consider intra-articular steroid injections to help with short-term pain relief.  There are other methods that they can explore for treatment as well.  Return to clinic for new or urgent symptoms.
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
## 2024-08-15 ENCOUNTER — Ambulatory Visit (INDEPENDENT_AMBULATORY_CARE_PROVIDER_SITE_OTHER)

## 2024-08-15 VITALS — BP 102/62 | Ht 67.0 in | Wt 168.0 lb

## 2024-08-15 DIAGNOSIS — M222X2 Patellofemoral disorders, left knee: Secondary | ICD-10-CM

## 2024-08-15 DIAGNOSIS — R29898 Other symptoms and signs involving the musculoskeletal system: Secondary | ICD-10-CM | POA: Diagnosis not present

## 2024-08-15 DIAGNOSIS — M25552 Pain in left hip: Secondary | ICD-10-CM
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
